# Patient Record
Sex: Male | Born: 1965
Health system: Southern US, Community
[De-identification: ages and names within clinical notes are randomized; demographics above are authoritative.]

## PROBLEM LIST (undated history)

## (undated) DIAGNOSIS — E119 Type 2 diabetes mellitus without complications: Secondary | ICD-10-CM

## (undated) DIAGNOSIS — I10 Essential (primary) hypertension: Secondary | ICD-10-CM

## (undated) DIAGNOSIS — E669 Obesity, unspecified: Secondary | ICD-10-CM

## (undated) DIAGNOSIS — E785 Hyperlipidemia, unspecified: Secondary | ICD-10-CM

## (undated) DIAGNOSIS — L309 Dermatitis, unspecified: Secondary | ICD-10-CM

## (undated) HISTORY — DX: Essential (primary) hypertension: I10

## (undated) HISTORY — PX: EYE SURGERY: SHX253

## (undated) HISTORY — PX: HERNIA REPAIR: SHX51

## (undated) HISTORY — DX: Hyperlipidemia, unspecified: E78.5

## (undated) HISTORY — DX: Obesity, unspecified: E66.9

## (undated) HISTORY — DX: Type 2 diabetes mellitus without complications: E11.9

## (undated) HISTORY — DX: Dermatitis, unspecified: L30.9

## (undated) HISTORY — PX: INGUINAL HERNIA REPAIR: SHX194

---

## 2014-03-24 ENCOUNTER — Emergency Department
Admission: EM | Admit: 2014-03-24 | Discharge: 2014-03-24 | Disposition: A | Discharge status: Home / Self Care | Payer: PRIVATE HEALTH INSURANCE | Source: Home / Self Care | Attending: Emergency Medicine | Admitting: Emergency Medicine

## 2014-03-24 ENCOUNTER — Encounter: Payer: Self-pay | Admitting: *Deleted

## 2014-03-24 DIAGNOSIS — L509 Urticaria, unspecified: Secondary | ICD-10-CM

## 2014-03-24 DIAGNOSIS — R21 Rash and other nonspecific skin eruption: Secondary | ICD-10-CM | POA: Diagnosis not present

## 2014-03-24 MED ORDER — PREDNISONE 10 MG PO TABS: ORAL_TABLET | ORAL | Status: DC

## 2014-03-24 NOTE — ED Provider Notes (Signed)
CSN: [id number]     Arrival date & time 03/24/14  0830 History   First MD Initiated Contact with Patient 03/24/14 [phone]     Chief Complaint  Patient presents with  . Rash   (Consider location/radiation/quality/duration/timing/severity/associated sxs/prior Treatment) Patient is a 49 y.o. male presenting with rash. The history is provided by the patient. No language interpreter was used.  Rash Location:  Shoulder/arm Shoulder/arm rash location:  L upper arm, R upper arm, L forearm and R forearm Quality: itchiness and redness   Severity:  Moderate Onset quality:  Gradual Duration:  2 days Timing:  Constant Progression:  Worsening Chronicity:  New Relieved by:  Nothing Worsened by:  Nothing tried Ineffective treatments:  None tried   History reviewed. No pertinent past medical history. Past Surgical History  Procedure Laterality Date  . Hernia repair    . Eye surgery     Family History  Problem Relation Age of Onset  . Thrombosis Mother   . Stroke Father   . Cancer Father    History  Substance Use Topics  . Smoking status: Never Smoker   . Smokeless tobacco: Never Used  . Alcohol Use: No    Review of Systems  Skin: Positive for rash.  All other systems reviewed and are negative.   Allergies  Review of patient's allergies indicates no known allergies.  Home Medications   Prior to Admission medications   Medication Sig Start Date End Date Taking? Authorizing Provider  predniSONE (DELTASONE) 10 MG tablet 6,5,4,3,2,1 taper 03/24/14   Lonia SkinnerLeslie K Sofia, PA-C   BP 150/102 mmHg  Pulse 93  Temp(Src) 97.9 F (36.6 C) (Oral)  Resp 14  Ht 5' 10.5" (1.791 m)  Wt 270 lb (122.471 kg)  BMI 38.18 kg/m2  SpO2 97% Physical Exam  Constitutional: He is oriented to person, place, and time. He appears well-developed and well-nourished.  HENT:  Head: Normocephalic and atraumatic.  Eyes: Conjunctivae and EOM are normal. Pupils are equal, round, and reactive to light.  Neck:  Normal range of motion.  Cardiovascular: Normal rate.   Pulmonary/Chest: Effort normal.  Abdominal: He exhibits no distension.  Musculoskeletal: Normal range of motion.  Neurological: He is alert and oriented to person, place, and time.  Skin:  Red raised whelps arms  Psychiatric: He has a normal mood and affect.  Nursing note and vitals reviewed.   ED Course  Procedures (including critical care time) Labs Review Labs Reviewed - No data to display  Imaging Review No results found.   MDM   1. Hives    Prednisone taper Benadryl AVS    Lonia SkinnerLeslie K EnetaiSofia, PA-C 03/24/14 [phone]

## 2014-03-24 NOTE — Discharge Instructions (Signed)
Hives Hives are itchy, red, swollen areas of the skin. They can vary in size and location on your body. Hives can come and go for hours or several days (acute hives) or for several weeks (chronic hives). Hives do not spread from person to person (noncontagious). They may get worse with scratching, exercise, and emotional stress. CAUSES   Allergic reaction to food, additives, or drugs.  Infections, including the common cold.  Illness, such as vasculitis, lupus, or thyroid disease.  Exposure to sunlight, heat, or cold.  Exercise.  Stress.  Contact with chemicals. SYMPTOMS   Red or white swollen patches on the skin. The patches may change size, shape, and location quickly and repeatedly.  Itching.  Swelling of the hands, feet, and face. This may occur if hives develop deeper in the skin. DIAGNOSIS  Your caregiver can usually tell what is wrong by performing a physical exam. Skin or blood tests may also be done to determine the cause of your hives. In some cases, the cause cannot be determined. TREATMENT  Mild cases usually get better with medicines such as antihistamines. Severe cases may require an emergency epinephrine injection. If the cause of your hives is known, treatment includes avoiding that trigger.  HOME CARE INSTRUCTIONS   Avoid causes that trigger your hives.  Take antihistamines as directed by your caregiver to reduce the severity of your hives. Non-sedating or low-sedating antihistamines are usually recommended. Do not drive while taking an antihistamine.  Take any other medicines prescribed for itching as directed by your caregiver.  Wear loose-fitting clothing.  Keep all follow-up appointments as directed by your caregiver. SEEK MEDICAL CARE IF:   You have persistent or severe itching that is not relieved with medicine.  You have painful or swollen joints. SEEK IMMEDIATE MEDICAL CARE IF:   You have a fever.  Your tongue or lips are swollen.  You have  trouble breathing or swallowing.  You feel tightness in the throat or chest.  You have abdominal pain. These problems may be the first sign of a life-threatening allergic reaction. Call your local emergency services (911 in [place] MAKE SURE YOU:   Understand these instructions.  Will watch your condition.  Will get help right away if you are not doing well or get worse. Document Released: 01/14/2005 Document Revised: 01/19/2013 Document Reviewed: 04/09/2011 [employer] Patient Information 2015 [employer]. This information is not intended to replace advice given to you by your health care provider. Make sure you discuss any questions you have with your health care provider.  

## 2014-03-24 NOTE — ED Notes (Signed)
Red, raised painful rash to both arms x yesterday AM. Pain with touch.

## 2014-05-16 ENCOUNTER — Telehealth: Payer: Self-pay | Admitting: *Deleted

## 2014-05-16 NOTE — Telephone Encounter (Signed)
Pre visit -unable to reach pt at this time , will call again.

## 2014-05-17 ENCOUNTER — Encounter: Payer: Self-pay | Admitting: Family Medicine

## 2014-05-17 ENCOUNTER — Ambulatory Visit (INDEPENDENT_AMBULATORY_CARE_PROVIDER_SITE_OTHER): Payer: Managed Care, Other (non HMO) | Admitting: Family Medicine

## 2014-05-17 VITALS — BP 158/99 | HR 82 | Temp 98.3°F | Ht 70.5 in | Wt 269.2 lb

## 2014-05-17 DIAGNOSIS — R21 Rash and other nonspecific skin eruption: Secondary | ICD-10-CM | POA: Diagnosis not present

## 2014-05-17 DIAGNOSIS — E669 Obesity, unspecified: Secondary | ICD-10-CM | POA: Diagnosis not present

## 2014-05-17 DIAGNOSIS — E785 Hyperlipidemia, unspecified: Secondary | ICD-10-CM | POA: Insufficient documentation

## 2014-05-17 DIAGNOSIS — I10 Essential (primary) hypertension: Secondary | ICD-10-CM | POA: Diagnosis not present

## 2014-05-17 DIAGNOSIS — E119 Type 2 diabetes mellitus without complications: Secondary | ICD-10-CM

## 2014-05-17 HISTORY — DX: Obesity, unspecified: E66.9

## 2014-05-17 LAB — TSH: TSH: 1.99 u[IU]/mL (ref 0.35–4.50)

## 2014-05-17 LAB — COMPREHENSIVE METABOLIC PANEL
ALT: 24 U/L (ref 0–53)
AST: 20 U/L (ref 0–37)
Albumin: 4.1 g/dL (ref 3.5–5.2)
Alkaline Phosphatase: 96 U/L (ref 39–117)
BILIRUBIN TOTAL: 0.7 mg/dL (ref 0.2–1.2)
BUN: 16 mg/dL (ref 6–23)
CO2: 26 meq/L (ref 19–32)
Calcium: 9.5 mg/dL (ref 8.4–10.5)
Chloride: 101 mEq/L (ref 96–112)
Creatinine, Ser: 0.75 mg/dL (ref 0.40–1.50)
GFR: 118.92 mL/min (ref >60.00)
GLUCOSE: 275 mg/dL — AB (ref 70–99)
Potassium: 4.1 mEq/L (ref 3.5–5.1)
SODIUM: 135 meq/L (ref 135–145)
TOTAL PROTEIN: 7.1 g/dL (ref 6.0–8.3)

## 2014-05-17 LAB — LIPID PANEL
CHOL/HDL RATIO: 5
Cholesterol: 193 mg/dL (ref 0–200)
HDL: 38.3 mg/dL — ABNORMAL LOW (ref >39.00)
LDL CALC: 118 mg/dL — AB (ref 0–99)
NONHDL: 154.7
Triglycerides: 185 mg/dL — ABNORMAL HIGH (ref 0.0–149.0)
VLDL: 37 mg/dL (ref 0.0–40.0)

## 2014-05-17 LAB — CBC
HCT: 44.4 % (ref 39.0–52.0)
HEMOGLOBIN: 15.3 g/dL (ref 13.0–17.0)
MCHC: 34.5 g/dL (ref 30.0–36.0)
MCV: 84.3 fl (ref 78.0–100.0)
Platelets: 271 10*3/uL (ref 150.0–400.0)
RBC: 5.27 Mil/uL (ref 4.22–5.81)
RDW: 13.2 % (ref 11.5–15.5)
WBC: 7.3 10*3/uL (ref 4.0–10.5)

## 2014-05-17 LAB — HEMOGLOBIN A1C: Hgb A1c MFr Bld: 10.9 % — ABNORMAL HIGH (ref 4.6–6.5)

## 2014-05-17 MED ORDER — LISINOPRIL 10 MG PO TABS: 10.0000 mg | ORAL_TABLET | Freq: Two times a day (BID) | ORAL | Status: DC

## 2014-05-17 MED ORDER — PERMETHRIN 5 % EX CREA: 1.0000 "application " | TOPICAL_CREAM | Freq: Once | CUTANEOUS | Status: DC

## 2014-05-17 MED ORDER — ATORVASTATIN CALCIUM 10 MG PO TABS
10.0000 mg | ORAL_TABLET | Freq: Every day | ORAL | Status: DC
Start: 2014-05-17 — End: 2014-07-01

## 2014-05-17 MED ORDER — METFORMIN HCL ER 750 MG PO TB24: 750.0000 mg | ORAL_TABLET | Freq: Three times a day (TID) | ORAL | Status: DC

## 2014-05-17 NOTE — Patient Instructions (Signed)
Dr Chrissie Noa zweig, in [place], [place] [facility] A healthy lifestyle and preventive care can promote health and wellness. Preventive health guidelines for men include the following key practices:  A routine yearly physical is a good way to check with your health care provider about your health and preventative screening. It is a chance to share any concerns and updates on your health and to receive a thorough exam.  Visit your dentist for a routine exam and preventative care every 6 months. Brush your teeth twice a day and floss once a day. Good oral hygiene prevents tooth decay and gum disease.  The frequency of eye exams is based on your age, health, family medical history, use of contact lenses, and other factors. Follow your health care provider's recommendations for frequency of eye exams.  Eat a healthy diet. Foods such as vegetables, fruits, whole grains, low-fat da    iry products, and lean protein foods contain the nutrients you need without too many calories. Decrease your intake of foods high in solid fats, added sugars, and salt. Eat the right amount of calories for you.Get information about a proper diet from your health care provider, if necessary.  Regular physical exercise is one of the most important things you can do for your health. Most adults should get at least 150 minutes of moderate-intensity exercise (any activity that increases your heart rate and causes you to sweat) each week. In addition, most adults need muscle-strengthening exercises on 2 or more days a week.  Maintain a healthy weight. The body mass index (BMI) is a screening tool to identify possible weight problems. It provides an estimate of body fat based on height and weight. Your health care provider can find your BMI and can help you achieve or maintain a healthy weight.For adults 20 years and older:  A BMI below 18.5 is considered underweight.  A BMI of 18.5 to 24.9 is normal.  A  BMI of 25 to 29.9 is considered overweight.  A BMI of 30 and above is considered obese.  Maintain normal blood lipids and cholesterol levels by exercising and minimizing your intake of saturated fat. Eat a balanced diet with plenty of fruit and vegetables. Blood tests for lipids and cholesterol should begin at age 75 and be repeated every 5 years. If your lipid or cholesterol levels are high, you are over 50, or you are at high risk for heart disease, you may need your cholesterol levels checked more frequently.Ongoing high lipid and cholesterol levels should be treated with medicines if diet and exercise are not working.  If you smoke, find out from your health care provider how to quit. If you do not use tobacco, do not start.  Lung cancer screening is recommended for adults aged 64-80 years who are at high risk for developing lung cancer because of a history of smoking. A yearly low-dose CT scan of the lungs is recommended for people who have at least a 30-pack-year history of smoking and are a current smoker or have quit within the past 15 years. A pack year of smoking is smoking an average of 1 pack of cigarettes a day for 1 year (for example: 1 pack a day for 30 years or 2 packs a day for 15 years). Yearly screening should continue until the smoker has stopped smoking for at least 15 years. Yearly screening should be stopped for people who develop a health problem that would prevent them from having lung cancer treatment.  If you choose to drink alcohol, do not have more than 2 drinks per day. One drink is considered to be 12 ounces (355 mL) of beer, 5 ounces (148 mL) of wine, or 1.5 ounces (44 mL) of liquor.  Avoid use of street drugs. Do not share needles with anyone. Ask for help if you need support or instructions about stopping the use of drugs.  High blood pressure causes heart disease and increases the risk of stroke. Your blood pressure should be checked at least every 1-2 years. Ongoing  high blood pressure should be treated with medicines, if weight loss and exercise are not effective.  If you are 60-40 years old, ask your health care provider if you should take aspirin to prevent heart disease.  Diabetes screening involves taking a blood sample to check your fasting blood sugar level. This should be done once every 3 years, after age 43, if you are within normal weight and without risk factors for diabetes. Testing should be considered at a younger age or be carried out more frequently if you are overweight and have at least 1 risk factor for diabetes.  Colorectal cancer can be detected and often prevented. Most routine colorectal cancer screening begins at the age of 58 and continues through age 45. However, your health care provider may recommend screening at an earlier age if you have risk factors for colon cancer. On a yearly basis, your health care provider may provide home test kits to check for hidden blood in the stool. Use of a small camera at the end of a tube to directly examine the colon (sigmoidoscopy or colonoscopy) can detect the earliest forms of colorectal cancer. Talk to your health care provider about this at age 57, when routine screening begins. Direct exam of the colon should be repeated every 5-10 years through age 49, unless early forms of precancerous polyps or small growths are found.  People who are at an increased risk for hepatitis B should be screened for this virus. You are considered at high risk for hepatitis B if:  You were born in a country where hepatitis B occurs often. Talk with your health care provider about which countries are considered high risk.  Your parents were born in a high-risk country and you have not received a shot to protect against hepatitis B (hepatitis B vaccine).  You have HIV or AIDS.  You use needles to inject street drugs.  You live with, or have sex with, someone who has hepatitis B.  You are a man who has sex with  other men (MSM).  You get hemodialysis treatment.  You take certain medicines for conditions such as cancer, organ transplantation, and autoimmune conditions.  Hepatitis C blood testing is recommended for all people born from [birth year] through 1965 and any individual with known risks for hepatitis C.  Practice safe sex. Use condoms and avoid high-risk sexual practices to reduce the spread of sexually transmitted infections (STIs). STIs include gonorrhea, chlamydia, syphilis, trichomonas, herpes, HPV, and human immunodeficiency virus (HIV). Herpes, HIV, and HPV are viral illnesses that have no cure. They can result in disability, cancer, and death.  If you are at risk of being infected with HIV, it is recommended that you take a prescription medicine daily to prevent HIV infection. This is called preexposure prophylaxis (PrEP). You are considered at risk if:  You are a man who has sex with other men (MSM) and have other risk factors.  You are a heterosexual man,  are sexually active, and are at increased risk for HIV infection.  You take drugs by injection.  You are sexually active with a partner who has HIV.  Talk with your health care provider about whether you are at high risk of being infected with HIV. If you choose to begin PrEP, you should first be tested for HIV. You should then be tested every 3 months for as long as you are taking PrEP.  A one-time screening for abdominal aortic aneurysm (AAA) and surgical repair of large AAAs by ultrasound are recommended for men ages 26 to 69 years who are current or former smokers.  Healthy men should no longer receive prostate-specific antigen (PSA) blood tests as part of routine cancer screening. Talk with your health care provider about prostate cancer screening.  Testicular cancer screening is not recommended for adult males who have no symptoms. Screening includes self-exam, a health care provider exam, and other screening tests. Consult with  your health care provider about any symptoms you have or any concerns you have about testicular cancer.  Use sunscreen. Apply sunscreen liberally and repeatedly throughout the day. You should seek shade when your shadow is shorter than you. Protect yourself by wearing long sleeves, pants, a wide-brimmed hat, and sunglasses year round, whenever you are outdoors.  Once a month, do a whole-body skin exam, using a mirror to look at the skin on your back. Tell your health care provider about new moles, moles that have irregular borders, moles that are larger than a pencil eraser, or moles that have changed in shape or color.  Stay current with required vaccines (immunizations).  Influenza vaccine. All adults should be immunized every year.  Tetanus, diphtheria, and acellular pertussis (Td, Tdap) vaccine. An adult who has not previously received Tdap or who does not know his vaccine status should receive 1 dose of Tdap. This initial dose should be followed by tetanus and diphtheria toxoids (Td) booster doses every 10 years. Adults with an unknown or incomplete history of completing a 3-dose immunization series with Td-containing vaccines should begin or complete a primary immunization series including a Tdap dose. Adults should receive a Td booster every 10 years.  Varicella vaccine. An adult without evidence of immunity to varicella should receive 2 doses or a second dose if he has previously received 1 dose.  Human papillomavirus (HPV) vaccine. Males aged 67-21 years who have not received the vaccine previously should receive the 3-dose series. Males aged 22-26 years may be immunized. Immunization is recommended through the age of 35 years for any male who has sex with males and did not get any or all doses earlier. Immunization is recommended for any person with an immunocompromised condition through the age of 65 years if he did not get any or all doses earlier. During the 3-dose series, the second dose  should be obtained 4-8 weeks after the first dose. The third dose should be obtained 24 weeks after the first dose and 16 weeks after the second dose.  Zoster vaccine. One dose is recommended for adults aged 38 years or older unless certain conditions are present.  Measles, mumps, and rubella (MMR) vaccine. Adults born before [birth year] generally are considered immune to measles and mumps. Adults born in [birth year] or later should have 1 or more doses of MMR vaccine unless there is a contraindication to the vaccine or there is laboratory evidence of immunity to each of the three diseases. A routine second dose of MMR vaccine should be obtained  at least 28 days after the first dose for students attending postsecondary schools, health care workers, or international travelers. People who received inactivated measles vaccine or an unknown type of measles vaccine during 1963-1967 should receive 2 doses of MMR vaccine. People who received inactivated mumps vaccine or an unknown type of mumps vaccine before 1979 and are at high risk for mumps infection should consider immunization with 2 doses of MMR vaccine. Unvaccinated health care workers born before [birth year] who lack laboratory evidence of measles, mumps, or rubella immunity or laboratory confirmation of disease should consider measles and mumps immunization with 2 doses of MMR vaccine or rubella immunization with 1 dose of MMR vaccine.  Pneumococcal 13-valent conjugate (PCV13) vaccine. When indicated, a person who is uncertain of his immunization history and has no record of immunization should receive the PCV13 vaccine. An adult aged 59 years or older who has certain medical conditions and has not been previously immunized should receive 1 dose of PCV13 vaccine. This PCV13 should be followed with a dose of pneumococcal polysaccharide (PPSV23) vaccine. The PPSV23 vaccine dose should be obtained at least 8 weeks after the dose of PCV13 vaccine. An adult aged 3 years or older  who has certain medical conditions and previously received 1 or more doses of PPSV23 vaccine should receive 1 dose of PCV13. The PCV13 vaccine dose should be obtained 1 or more years after the last PPSV23 vaccine dose.  Pneumococcal polysaccharide (PPSV23) vaccine. When PCV13 is also indicated, PCV13 should be obtained first. All adults aged 54 years and older should be immunized. An adult younger than age 28 years who has certain medical conditions should be immunized. Any person who resides in a nursing home or long-term care facility should be immunized. An adult smoker should be immunized. People with an immunocompromised condition and certain other conditions should receive both PCV13 and PPSV23 vaccines. People with human immunodeficiency virus (HIV) infection should be immunized as soon as possible after diagnosis. Immunization during chemotherapy or radiation therapy should be avoided. Routine use of PPSV23 vaccine is not recommended for American Indians, [place] Natives, or people younger than 65 years unless there are medical conditions that require PPSV23 vaccine. When indicated, people who have unknown immunization and have no record of immunization should receive PPSV23 vaccine. One-time revaccination 5 years after the first dose of PPSV23 is recommended for people aged 19-64 years who have chronic kidney failure, nephrotic syndrome, asplenia, or immunocompromised conditions. People who received 1-2 doses of PPSV23 before age 16 years should receive another dose of PPSV23 vaccine at age 2 years or later if at least 5 years have passed since the previous dose. Doses of PPSV23 are not needed for people immunized with PPSV23 at or after age 55 years.  Meningococcal vaccine. Adults with asplenia or persistent complement component deficiencies should receive 2 doses of quadrivalent meningococcal conjugate (MenACWY-D) vaccine. The doses should be obtained at least 2 months apart. Microbiologists working  with certain meningococcal bacteria, [place] recruits, people at risk during an outbreak, and people who travel to or live in countries with a high rate of meningitis should be immunized. A first-year college student up through age 106 years who is living in a residence hall should receive a dose if he did not receive a dose on or after his 16th birthday. Adults who have certain high-risk conditions should receive one or more doses of vaccine.  Hepatitis A vaccine. Adults who wish to be protected from this disease, have certain high-risk conditions,  work with hepatitis A-infected animals, work in hepatitis A research labs, or travel to or work in countries with a high rate of hepatitis A should be immunized. Adults who were previously unvaccinated and who anticipate close contact with an international adoptee during the first 60 days after arrival in the [place] from a country with a high rate of hepatitis A should be immunized.  Hepatitis B vaccine. Adults should be immunized if they wish to be protected from this disease, have certain high-risk conditions, may be exposed to blood or other infectious body fluids, are household contacts or sex partners of hepatitis B positive people, are clients or workers in certain care facilities, or travel to or work in countries with a high rate of hepatitis B.  Haemophilus influenzae type b (Hib) vaccine. A previously unvaccinated person with asplenia or sickle cell disease or having a scheduled splenectomy should receive 1 dose of Hib vaccine. Regardless of previous immunization, a recipient of a hematopoietic stem cell transplant should receive a 3-dose series 6-12 months after his successful transplant. Hib vaccine is not recommended for adults with HIV infection. Preventive Service / Frequency Ages 22 to 34  Blood pressure check.** / Every 1 to 2 years.  Lipid and cholesterol check.** / Every 5 years beginning at age 55.  Hepatitis C blood test.** / For  any individual with known risks for hepatitis C.  Skin self-exam. / Monthly.  Influenza vaccine. / Every year.  Tetanus, diphtheria, and acellular pertussis (Tdap, Td) vaccine.** / Consult your health care provider. 1 dose of Td every 10 years.  Varicella vaccine.** / Consult your health care provider.  HPV vaccine. / 3 doses over 6 months, if 1 or younger.  Measles, mumps, rubella (MMR) vaccine.** / You need at least 1 dose of MMR if you were born in 1957 or later. You may also need a second dose.  Pneumococcal 13-valent conjugate (PCV13) vaccine.** / Consult your health care provider.  Pneumococcal polysaccharide (PPSV23) vaccine.** / 1 to 2 doses if you smoke cigarettes or if you have certain conditions.  Meningococcal vaccine.** / 1 dose if you are age 87 to 37 years and a Market researcher living in a residence hall, or have one of several medical conditions. You may also need additional booster doses.  Hepatitis A vaccine.** / Consult your health care provider.  Hepatitis B vaccine.** / Consult your health care provider.  Haemophilus influenzae type b (Hib) vaccine.** / Consult your health care provider. Ages 32 to 52  Blood pressure check.** / Every 1 to 2 years.  Lipid and cholesterol check.** / Every 5 years beginning at age 50.  Lung cancer screening. / Every year if you are aged 56-80 years and have a 30-pack-year history of smoking and currently smoke or have quit within the past 15 years. Yearly screening is stopped once you have quit smoking for at least 15 years or develop a health problem that would prevent you from having lung cancer treatment.  Fecal occult blood test (FOBT) of stool. / Every year beginning at age 22 and continuing until age 82. You may not have to do this test if you get a colonoscopy every 10 years.  Flexible sigmoidoscopy** or colonoscopy.** / Every 5 years for a flexible sigmoidoscopy or every 10 years for a colonoscopy beginning at  age 9 and continuing until age 73.  Hepatitis C blood test.** / For all people born from [birth year] through 1963 and any individual with known risks for  hepatitis C.  Skin self-exam. / Monthly.  Influenza vaccine. / Every year.  Tetanus, diphtheria, and acellular pertussis (Tdap/Td) vaccine.** / Consult your health care provider. 1 dose of Td every 10 years.  Varicella vaccine.** / Consult your health care provider.  Zoster vaccine.** / 1 dose for adults aged 4 years or older.  Measles, mumps, rubella (MMR) vaccine.** / You need at least 1 dose of MMR if you were born in 1955 or later. You may also need a second dose.  Pneumococcal 13-valent conjugate (PCV13) vaccine.** / Consult your health care provider.  Pneumococcal polysaccharide (PPSV23) vaccine.** / 1 to 2 doses if you smoke cigarettes or if you have certain conditions.  Meningococcal vaccine.** / Consult your health care provider.  Hepatitis A vaccine.** / Consult your health care provider.  Hepatitis B vaccine.** / Consult your health care provider.  Haemophilus influenzae type b (Hib) vaccine.** / Consult your health care provider. Ages 57 and over  Blood pressure check.** / Every 1 to 2 years.  Lipid and cholesterol check.**/ Every 5 years beginning at age 66.  Lung cancer screening. / Every year if you are aged 41-80 years and have a 30-pack-year history of smoking and currently smoke or have quit within the past 15 years. Yearly screening is stopped once you have quit smoking for at least 15 years or develop a health problem that would prevent you from having lung cancer treatment.  Fecal occult blood test (FOBT) of stool. / Every year beginning at age 26 and continuing until age 27. You may not have to do this test if you get a colonoscopy every 10 years.  Flexible sigmoidoscopy** or colonoscopy.** / Every 5 years for a flexible sigmoidoscopy or every 10 years for a colonoscopy beginning at age 90 and continuing until  age 71.  Hepatitis C blood test.** / For all people born from [birth year] through 1963 and any individual with known risks for hepatitis C.  Abdominal aortic aneurysm (AAA) screening.** / A one-time screening for ages 23 to 21 years who are current or former smokers.  Skin self-exam. / Monthly.  Influenza vaccine. / Every year.  Tetanus, diphtheria, and acellular pertussis (Tdap/Td) vaccine.** / 1 dose of Td every 10 years.  Varicella vaccine.** / Consult your health care provider.  Zoster vaccine.** / 1 dose for adults aged 42 years or older.  Pneumococcal 13-valent conjugate (PCV13) vaccine.** / Consult your health care provider.  Pneumococcal polysaccharide (PPSV23) vaccine.** / 1 dose for all adults aged 85 years and older.  Meningococcal vaccine.** / Consult your health care provider.  Hepatitis A vaccine.** / Consult your health care provider.  Hepatitis B vaccine.** / Consult your health care provider.  Haemophilus influenzae type b (Hib) vaccine.** / Consult your health care provider. **Family history and personal history of risk and conditions may change your health care provider's recommendations. Document Released: 03/12/2001 Document Revised: 01/19/2013 Document Reviewed: 06/11/2010 [facility] Patient Information 2015 [place], [place]. This information is not intended to replace advice given to you by your health care provider. Make sure you discuss any questions you have with your health care provider.

## 2014-05-17 NOTE — Progress Notes (Signed)
Pre visit review using our clinic review tool, if applicable. No additional management support is needed unless otherwise documented below in the visit note. 

## 2014-05-22 DIAGNOSIS — R21 Rash and other nonspecific skin eruption: Secondary | ICD-10-CM | POA: Insufficient documentation

## 2014-05-22 NOTE — Assessment & Plan Note (Signed)
Consider bed bugs. Started on Permethrin cream as directed and perform a deep cleanse

## 2014-05-22 NOTE — Progress Notes (Signed)
Craig RankinsKenneth Waldman  [id number] July 10, 1965 05/22/2014      Progress Note-Follow Up  Subjective  Chief Complaint  Chief Complaint  Patient presents with  . Establish Care    HPI  Patient is a 49 y.o. male in today for routine medical care. Patient is in today for new patient appointment. He is due to the area beneath the primary care doctor. Will request old records. Has a pruritic maculopapular rash noted diffusely but most notably at the maximal his knees and in the flexor surfaces of his elbows. He has been staying in a motel. Recent illness although he does note significant fatigue. He is out of all his medications at present time. Denies CP/palp/SOB/HA/congestion/fevers/GI or GU c/o. Taking meds as prescribed  Past Medical History  Diagnosis Date  . Diabetes mellitus without complication   . Hyperlipidemia   . Hypertension   . Obesity 05/17/2014    Past Surgical History  Procedure Laterality Date  . Eye surgery    . Inguinal hernia repair    . Hernia repair      Family History  Problem Relation Age of Onset  . Stroke Mother     ?  Marland Kitchen. Thrombosis Mother     brain aneurysm  . Clotting disorder Mother     DVTs  . Cancer Father   . Stroke Father     3 strokes  . Cancer Sister     breast  . Cancer Paternal Grandfather     leukemia  . Clotting disorder Sister     lupus anticoagulant positive  . Hyperlipidemia Brother   . Hypertension Brother   . Heart disease Brother     MI    History   Social History  . Marital Status: Married    Spouse Name: N/A  . Number of Children: N/A  . Years of Education: 12   Occupational History  . 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 Smoking status: Never Smoker   . Smokeless tobacco: Never Used  . Alcohol Use: No  . Drug Use: No  . 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 Not on file   Social History Narrative    No current outpatient prescriptions on file prior to visit.   No current facility-administered medications on file prior to visit.    No Known Allergies  Review of Systems  Review of Systems  Constitutional: Positive for malaise/fatigue. Negative for fever and chills.  HENT: Negative for congestion, hearing loss and nosebleeds.   Eyes: Negative for discharge.  Respiratory: Negative for cough, sputum production, shortness of breath and wheezing.   Cardiovascular: Negative for chest pain, palpitations and leg swelling.  Gastrointestinal: Negative for heartburn, nausea, vomiting, abdominal pain, diarrhea, constipation and blood in stool.  Genitourinary: Negative for dysuria, urgency, frequency and hematuria.  Musculoskeletal: Negative for myalgias, back pain and falls.  Skin: Negative for rash.  Neurological: Negative for dizziness, tremors, sensory change, focal weakness, loss of consciousness, weakness and headaches.  Endo/Heme/Allergies: Negative for polydipsia. Does not bruise/bleed easily.  Psychiatric/Behavioral: Positive for depression. Negative for suicidal ideas. The patient is nervous/anxious. The patient does not have insomnia.     Objective  BP 158/99 mmHg  Pulse 82  Temp(Src) 98.3 F (36.8 C) (Oral)  Ht 5' 10.5" (1.791 m)  Wt 269 lb 4  oz (122.131 kg)  BMI 38.07 kg/m2  SpO2 99%  Physical Exam  Physical Exam  Constitutional: He is oriented to person, place, and time and well-developed, well-nourished, and in no distress. No distress.  HENT:  Head: Normocephalic and atraumatic.  Eyes: Conjunctivae are normal.  Neck: Neck supple. No thyromegaly present.  Cardiovascular: Normal rate, regular rhythm and normal heart sounds.   No murmur heard. Pulmonary/Chest: Effort normal and breath sounds normal. No respiratory distress.  Abdominal: He exhibits no distension and no mass. There is no tenderness.    Musculoskeletal: He exhibits no edema.  Neurological: He is alert and oriented to person, place, and time.  Skin: Skin is warm.  Psychiatric: Memory, affect and judgment normal.    Lab Results  Component Value Date   TSH 1.99 05/17/2014   Lab Results  Component Value Date   WBC 7.3 05/17/2014   HGB 15.3 05/17/2014   HCT 44.4 05/17/2014   MCV 84.3 05/17/2014   PLT 271.0 05/17/2014   Lab Results  Component Value Date   CREATININE 0.75 05/17/2014   BUN 16 05/17/2014   NA 135 05/17/2014   K 4.1 05/17/2014   CL 101 05/17/2014   CO2 26 05/17/2014   Lab Results  Component Value Date   ALT 24 05/17/2014   AST 20 05/17/2014   ALKPHOS 96 05/17/2014   BILITOT 0.7 05/17/2014   Lab Results  Component Value Date   CHOL 193 05/17/2014   Lab Results  Component Value Date   HDL 38.30* 05/17/2014   Lab Results  Component Value Date   LDLCALC 118* 05/17/2014   Lab Results  Component Value Date   TRIG 185.0* 05/17/2014   Lab Results  Component Value Date   CHOLHDL 5 05/17/2014     Assessment & Plan  Hypertension Poorly controlled will alter medications, encouraged DASH diet, minimize caffeine and obtain adequate sleep. Report concerning symptoms and follow up as directed and as needed. Increase Lisinopril to bid   Diabetes mellitus without complication Has been out of meds. hgba1c unacceptable, minimize simple carbs. Increase exercise as tolerated. Restart Metformin tid   Rash and nonspecific skin eruption Consider bed bugs. Started on Permethrin cream as directed and perform a deep cleanse   Hyperlipidemia Tolerating statin, encouraged heart healthy diet, avoid trans fats, minimize simple carbs and saturated fats. Increase exercise as tolerated. Restart statin   Obesity Encouraged DASH diet, decrease po intake and increase exercise as tolerated. Needs 7-8 hours of sleep nightly. Avoid trans fats, eat small, frequent meals every 4-5 hours with lean proteins,  complex carbs and healthy fats. Minimize simple carbs

## 2014-05-22 NOTE — Assessment & Plan Note (Signed)
Poorly controlled will alter medications, encouraged DASH diet, minimize caffeine and obtain adequate sleep. Report concerning symptoms and follow up as directed and as needed. Increase Lisinopril to bid 

## 2014-05-22 NOTE — Assessment & Plan Note (Signed)
Encouraged DASH diet, decrease po intake and increase exercise as tolerated. Needs 7-8 hours of sleep nightly. Avoid trans fats, eat small, frequent meals every 4-5 hours with lean proteins, complex carbs and healthy fats. Minimize simple carbs 

## 2014-05-22 NOTE — Assessment & Plan Note (Signed)
Tolerating statin, encouraged heart healthy diet, avoid trans fats, minimize simple carbs and saturated fats. Increase exercise as tolerated. Restart statin

## 2014-05-22 NOTE — Assessment & Plan Note (Signed)
Has been out of meds. hgba1c unacceptable, minimize simple carbs. Increase exercise as tolerated. Restart Metformin tid

## 2014-05-23 ENCOUNTER — Telehealth: Payer: Self-pay | Admitting: Family Medicine

## 2014-05-23 NOTE — Telephone Encounter (Signed)
Patient returned phone call. Best # [phone]

## 2014-05-23 NOTE — Telephone Encounter (Signed)
Called the patient left a detailed message to call back with info. Regarding specific glucometer/strips/lancets to send in to his local pharmacy per PCP.

## 2014-05-24 NOTE — Telephone Encounter (Signed)
Called left a detailed message of request. 

## 2014-05-24 NOTE — Telephone Encounter (Signed)
Spoke to the patient and he is moving at this time.  He would like to wait for a couple weeks before sending in a prescription due to money.  States will contact his insurance to find out their preference and either let us or pharmacy know what to order.

## 2014-07-01 ENCOUNTER — Encounter: Payer: Self-pay | Admitting: Family Medicine

## 2014-07-01 ENCOUNTER — Ambulatory Visit (HOSPITAL_BASED_OUTPATIENT_CLINIC_OR_DEPARTMENT_OTHER)
Admission: RE | Admit: 2014-07-01 | Discharge: 2014-07-01 | Disposition: A | Discharge status: Home / Self Care | Payer: Managed Care, Other (non HMO) | Source: Ambulatory Visit | Attending: Family Medicine | Admitting: Family Medicine

## 2014-07-01 ENCOUNTER — Ambulatory Visit (INDEPENDENT_AMBULATORY_CARE_PROVIDER_SITE_OTHER): Payer: Managed Care, Other (non HMO) | Admitting: Family Medicine

## 2014-07-01 VITALS — BP 143/94 | HR 78 | Temp 98.5°F | Ht 70.0 in | Wt 267.4 lb

## 2014-07-01 DIAGNOSIS — M25511 Pain in right shoulder: Secondary | ICD-10-CM

## 2014-07-01 DIAGNOSIS — I10 Essential (primary) hypertension: Secondary | ICD-10-CM

## 2014-07-01 DIAGNOSIS — E119 Type 2 diabetes mellitus without complications: Secondary | ICD-10-CM | POA: Diagnosis not present

## 2014-07-01 DIAGNOSIS — R21 Rash and other nonspecific skin eruption: Secondary | ICD-10-CM

## 2014-07-01 DIAGNOSIS — E669 Obesity, unspecified: Secondary | ICD-10-CM | POA: Diagnosis not present

## 2014-07-01 DIAGNOSIS — L309 Dermatitis, unspecified: Secondary | ICD-10-CM

## 2014-07-01 DIAGNOSIS — Z832 Family history of diseases of the blood and blood-forming organs and certain disorders involving the immune mechanism: Secondary | ICD-10-CM

## 2014-07-01 DIAGNOSIS — E785 Hyperlipidemia, unspecified: Secondary | ICD-10-CM

## 2014-07-01 DIAGNOSIS — Z8249 Family history of ischemic heart disease and other diseases of the circulatory system: Secondary | ICD-10-CM

## 2014-07-01 HISTORY — DX: Dermatitis, unspecified: L30.9

## 2014-07-01 MED ORDER — METFORMIN HCL ER 750 MG PO TB24: 750.0000 mg | ORAL_TABLET | Freq: Three times a day (TID) | ORAL | Status: DC

## 2014-07-01 MED ORDER — ATORVASTATIN CALCIUM 10 MG PO TABS: 10.0000 mg | ORAL_TABLET | Freq: Every day | ORAL | Status: DC

## 2014-07-01 MED ORDER — LISINOPRIL 10 MG PO TABS: 10.0000 mg | ORAL_TABLET | Freq: Two times a day (BID) | ORAL | Status: DC

## 2014-07-01 NOTE — Assessment & Plan Note (Signed)
Consider bed bugs but patient has not tried the Permethrin cream yet encouraged to try

## 2014-07-01 NOTE — Patient Instructions (Addendum)
Basic Carbohydrate Counting for Diabetes Mellitus Carbohydrate counting is a method for keeping track of the amount of carbohydrates you eat. Eating carbohydrates naturally increases the level of sugar (glucose) in your blood, so it is important for you to know the amount that is okay for you to have in every meal. Carbohydrate counting helps keep the level of glucose in your blood within normal limits. The amount of carbohydrates allowed is different for every person. A dietitian can help you calculate the amount that is right for you. Once you know the amount of carbohydrates you can have, you can count the carbohydrates in the foods you want to eat. Carbohydrates are found in the following foods:  Grains, such as breads and cereals.  Dried beans and soy products.  Starchy vegetables, such as potatoes, peas, and corn.  Fruit and fruit juices.  Milk and yogurt.  Sweets and snack foods, such as cake, cookies, candy, chips, soft drinks, and fruit drinks. CARBOHYDRATE COUNTING There are two ways to count the carbohydrates in your food. You can use either of the methods or a combination of both. Reading the "Nutrition Facts" on Packaged Food The "Nutrition Facts" is an area that is included on the labels of almost all packaged food and beverages in the [place]. It includes the serving size of that food or beverage and information about the nutrients in each serving of the food, including the grams (g) of carbohydrate per serving.  Decide the number of servings of this food or beverage that you will be able to eat or drink. Multiply that number of servings by the number of grams of carbohydrate that is listed on the label for that serving. The total will be the amount of carbohydrates you will be having when you eat or drink this food or beverage. Learning Standard Serving Sizes of Food When you eat food that is not packaged or does not include "Nutrition Facts" on the label, you need to  measure the servings in order to count the amount of carbohydrates.A serving of most carbohydrate-rich foods contains about 15 g of carbohydrates. The following list includes serving sizes of carbohydrate-rich foods that provide 15 g ofcarbohydrate per serving:   1 slice of bread (1 oz) or 1 six-inch tortilla.    of a hamburger bun or English muffin.  4-6 crackers.   cup unsweetened dry cereal.    cup hot cereal.   cup rice or pasta.    cup mashed potatoes or  of a large baked potato.  1 cup fresh fruit or one small piece of fruit.    cup canned or frozen fruit or fruit juice.  1 cup milk.   cup plain fat-free yogurt or yogurt sweetened with artificial sweeteners.   cup cooked dried beans or starchy vegetable, such as peas, corn, or potatoes.  Decide the number of standard-size servings that you will eat. Multiply that number of servings by 15 (the grams of carbohydrates in that serving). For example, if you eat 2 cups of strawberries, you will have eaten 2 servings and 30 g of carbohydrates (2 servings x 15 g = 30 g). For foods such as soups and casseroles, in which more than one food is mixed in, you will need to count the carbohydrates in each food that is included. EXAMPLE OF CARBOHYDRATE COUNTING Sample Dinner  3 oz chicken breast.   cup of brown rice.   cup of corn.  1 cup milk.   1 cup strawberries with   sugar-free whipped topping.  Carbohydrate Calculation Step 1: Identify the foods that contain carbohydrates:   Rice.   Corn.   Milk.   Strawberries. Step 2:Calculate the number of servings eaten of each:   2 servings of rice.   1 serving of corn.   1 serving of milk.   1 serving of strawberries. Step 3: Multiply each of those number of servings by 15 g:   2 servings of rice x 15 g = 30 g.   1 serving of corn x 15 g = 15 g.   1 serving of milk x 15 g = 15 g.   1 serving of strawberries x 15 g = 15 g. Step 4: Add  together all of the amounts to find the total grams of carbohydrates eaten: 30 g + 15 g + 15 g + 15 g = 75 g. Document Released: 01/14/2005 Document Revised: 05/31/2013 Document Reviewed: 12/11/2012 [facility] Patient Information 2015 [place], [place]. This information is not intended to replace advice given to you by your health care provider. Make sure you discuss any questions you have with your health care provider. Shoulder Pain The shoulder is the joint that connects your arms to your body. The bones that form the shoulder joint include the upper arm bone (humerus), the shoulder blade (scapula), and the collarbone (clavicle). The top of the humerus is shaped like a ball and fits into a rather flat socket on the scapula (glenoid cavity). A combination of muscles and strong, fibrous tissues that connect muscles to bones (tendons) support your shoulder joint and hold the ball in the socket. Small, fluid-filled sacs (bursae) are located in different areas of the joint. They act as cushions between the bones and the overlying soft tissues and help reduce friction between the gliding tendons and the bone as you move your arm. Your shoulder joint allows a wide range of motion in your arm. This range of motion allows you to do things like scratch your back or throw a ball. However, this range of motion also makes your shoulder more prone to pain from overuse and injury. Causes of shoulder pain can originate from both injury and overuse and usually can be grouped in the following four categories:  Redness, swelling, and pain (inflammation) of the tendon (tendinitis) or the bursae (bursitis).  Instability, such as a dislocation of the joint.  Inflammation of the joint (arthritis).  Broken bone (fracture). HOME CARE INSTRUCTIONS   Apply ice to the sore area.  Put ice in a plastic bag.  Place a towel between your skin and the bag.  Leave the ice on for 15-20 minutes, 3-4 times per day for the first 2  days, or as directed by your health care provider.  Stop using cold packs if they do not help with the pain.  If you have a shoulder sling or immobilizer, wear it as long as your caregiver instructs. Only remove it to shower or bathe. Move your arm as little as possible, but keep your hand moving to prevent swelling.  Squeeze a soft ball or foam pad as much as possible to help prevent swelling.  Only take over-the-counter or prescription medicines for pain, discomfort, or fever as directed by your caregiver. SEEK MEDICAL CARE IF:   Your shoulder pain increases, or new pain develops in your arm, hand, or fingers.  Your hand or fingers become cold and numb.  Your pain is not relieved with medicines. SEEK IMMEDIATE MEDICAL CARE IF:   Your  arm, hand, or fingers are numb or tingling.  Your arm, hand, or fingers are significantly swollen or turn white or blue. MAKE SURE YOU:   Understand these instructions.  Will watch your condition.  Will get help right away if you are not doing well or get worse. Document Released: 10/24/2004 Document Revised: 05/31/2013 Document Reviewed: 12/29/2010 [facility] Patient Information 2015 [place], [place]. This information is not intended to replace advice given to you by your health care provider. Make sure you discuss any questions you have with your health care provider.

## 2014-07-01 NOTE — Assessment & Plan Note (Signed)
Well controlled, no changes to meds. Encouraged heart healthy diet such as the DASH diet and exercise as tolerated.  °

## 2014-07-01 NOTE — Assessment & Plan Note (Signed)
Fell on right side last night with pain and decreased ROM, check Xray, ice and Salon pas patches bid to tid and report if persists for referal to sports med

## 2014-07-01 NOTE — Progress Notes (Signed)
Craig Vincent  [id number] Jun 15, 1965 07/01/2014      Progress Note-Follow Up  Subjective  Chief Complaint  Chief Complaint  Patient presents with  . Follow-up    HPI  Patient is a 49 y.o. male in today for routine medical care. Patient is in today for follow-up and generally is doing well but does offer some complaints. Did slip and fall on some wet grass last night and is having some right arm pain. Had immediate pain but it is not worsening. No significant swelling or neuropathic symptoms are noted. Does note some mild polyuria but denies polydipsia. Also notes some mild fatigue. Reports a family history of blood clots most notably in his mother and sister. His sister has been diagnosed with lupus anticoagulated. Patient himself has not had any blood clots. Denies CP/palp/SOB/HA/congestion/fevers/GI or GU c/o. Taking meds as prescribed  Past Medical History  Diagnosis Date  . Diabetes mellitus without complication   . Hyperlipidemia   . Hypertension   . Obesity 05/17/2014    Past Surgical History  Procedure Laterality Date  . Eye surgery    . Inguinal hernia repair    . Hernia repair      Family History  Problem Relation Age of Onset  . Stroke Mother     ?  Marland Kitchen Thrombosis Mother     brain aneurysm  . Clotting disorder Mother     DVTs  . Cancer Father   . Stroke Father     3 strokes  . Cancer Sister     breast  . Cancer Paternal Grandfather     leukemia  . Clotting disorder Sister     lupus anticoagulant positive  . Hyperlipidemia Brother   . Hypertension Brother   . Heart disease Brother     MI    History   Social History  . Marital Status: Married    Spouse Name: N/A  . Number of Children: N/A  . Years of Education: 12   Occupational History  . 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 Smoking status: Never Smoker   . Smokeless tobacco: Never Used  . Alcohol Use: No  . Drug Use: No  . Sexual Activity: Yes   Comment: works for a [employer] engineer, recently transferred to [employer], no dietary restrictions, wife lives in [place]   Other Topics Concern  . Not on file   Social History Narrative    Current Outpatient Prescriptions on File Prior to Visit  Medication Sig Dispense Refill  . permethrin (ACTICIN) 5 % cream Apply 1 application topically once. Wash off after 8 hours Repeat treatment in 3-4 weeks if persists 60 g 1   No current facility-administered medications on file prior to visit.    No Known Allergies  Review of Systems  Review of Systems  Constitutional: Positive for malaise/fatigue. Negative for fever.  HENT: Negative for congestion.   Eyes: Negative for discharge.  Respiratory: Negative for shortness of breath.   Cardiovascular: Negative for chest pain, palpitations and leg swelling.  Gastrointestinal: Negative for nausea, abdominal pain, diarrhea and constipation.  Genitourinary: Positive for frequency. Negative for dysuria and urgency.  Musculoskeletal: Positive for joint pain. Negative for falls.  Skin: Negative for rash.  Neurological: Negative for loss of consciousness and headaches.  Endo/Heme/Allergies: Negative for polydipsia.  Psychiatric/Behavioral: Negative for depression and suicidal ideas. The patient is not nervous/anxious and does not have insomnia.     Objective  BP 143/94 mmHg  Pulse 78  Temp(Src) 98.5 F (36.9 C) (Oral)  Ht 5\' 10"  (1.778 m)  Wt 267 lb 6 oz (121.281 kg)  BMI 38.36 kg/m2  SpO2 98%  Physical Exam  Physical Exam  Constitutional: He is oriented to person, place, and time and well-developed, well-nourished, and in no distress. No distress.  HENT:  Head: Normocephalic and atraumatic.  Eyes: Conjunctivae are normal.  Neck: Neck supple. No thyromegaly present.  Cardiovascular: Normal rate, regular rhythm and normal heart sounds.   No murmur heard. Pulmonary/Chest: Effort normal and breath sounds normal. No respiratory distress.    Abdominal: He exhibits no distension and no mass. There is no tenderness.  Musculoskeletal: He exhibits no edema.  Neurological: He is alert and oriented to person, place, and time.  Skin: Skin is warm.  Psychiatric: Memory, affect and judgment normal.    Lab Results  Component Value Date   TSH 1.99 05/17/2014   Lab Results  Component Value Date   WBC 7.3 05/17/2014   HGB 15.3 05/17/2014   HCT 44.4 05/17/2014   MCV 84.3 05/17/2014   PLT 271.0 05/17/2014   Lab Results  Component Value Date   CREATININE 0.75 05/17/2014   BUN 16 05/17/2014   NA 135 05/17/2014   K 4.1 05/17/2014   CL 101 05/17/2014   CO2 26 05/17/2014   Lab Results  Component Value Date   ALT 24 05/17/2014   AST 20 05/17/2014   ALKPHOS 96 05/17/2014   BILITOT 0.7 05/17/2014   Lab Results  Component Value Date   CHOL 193 05/17/2014   Lab Results  Component Value Date   HDL 38.30* 05/17/2014   Lab Results  Component Value Date   LDLCALC 118* 05/17/2014   Lab Results  Component Value Date   TRIG 185.0* 05/17/2014   Lab Results  Component Value Date   CHOLHDL 5 05/17/2014     Assessment & Plan  Diabetes mellitus without complication Patient has not picked up his glucometer yet, is given new prescription check daily and prn. hgba1c is unacceptable, minimize simple carbs. Increase exercise as tolerated. Continue current meds   Hypertension Well controlled, no changes to meds. Encouraged heart healthy diet such as the DASH diet and exercise as tolerated.    Hyperlipidemia Tolerating statin, encouraged heart healthy diet, avoid trans fats, minimize simple carbs and saturated fats. Increase exercise as tolerated   Obesity Encouraged DASH diet, decrease po intake and increase exercise as tolerated. Needs 7-8 hours of sleep nightly. Avoid trans fats, eat small, frequent meals every 4-5 hours with lean proteins, complex carbs and healthy fats. Minimize simple carbs, GMO foods.   Right  shoulder pain Fell on right side last night with pain and decreased ROM, check Xray, ice and Salon pas patches bid to tid and report if persists for referal to sports med   Dermatitis Consider bed bugs but patient has not tried the Permethrin cream yet encouraged to try

## 2014-07-01 NOTE — Assessment & Plan Note (Signed)
Encouraged DASH diet, decrease po intake and increase exercise as tolerated. Needs 7-8 hours of sleep nightly. Avoid trans fats, eat small, frequent meals every 4-5 hours with lean proteins, complex carbs and healthy fats. Minimize simple carbs, GMO foods. 

## 2014-07-01 NOTE — Assessment & Plan Note (Signed)
Patient has not picked up his glucometer yet, is given new prescription check daily and prn. hgba1c is unacceptable, minimize simple carbs. Increase exercise as tolerated. Continue current meds

## 2014-07-01 NOTE — Assessment & Plan Note (Signed)
Tolerating statin, encouraged heart healthy diet, avoid trans fats, minimize simple carbs and saturated fats. Increase exercise as tolerated 

## 2014-07-16 ENCOUNTER — Encounter: Payer: Self-pay | Admitting: Family Medicine

## 2014-07-16 NOTE — Assessment & Plan Note (Signed)
Patient reports his mother and sister have had blood clots and they have been advised family members should be evaluated. Sister with possible Lupus anticoagulant. Will refer to hemaotlogy for further consideration

## 2014-08-25 ENCOUNTER — Other Ambulatory Visit: Payer: Managed Care, Other (non HMO)

## 2014-08-26 ENCOUNTER — Other Ambulatory Visit: Payer: Managed Care, Other (non HMO)

## 2014-09-02 ENCOUNTER — Ambulatory Visit: Payer: Managed Care, Other (non HMO) | Admitting: Family Medicine

## 2014-09-20 ENCOUNTER — Other Ambulatory Visit (INDEPENDENT_AMBULATORY_CARE_PROVIDER_SITE_OTHER): Payer: Managed Care, Other (non HMO)

## 2014-09-20 DIAGNOSIS — E669 Obesity, unspecified: Secondary | ICD-10-CM

## 2014-09-20 DIAGNOSIS — E119 Type 2 diabetes mellitus without complications: Secondary | ICD-10-CM | POA: Diagnosis not present

## 2014-09-20 DIAGNOSIS — E785 Hyperlipidemia, unspecified: Secondary | ICD-10-CM | POA: Diagnosis not present

## 2014-09-20 DIAGNOSIS — I10 Essential (primary) hypertension: Secondary | ICD-10-CM

## 2014-09-20 LAB — COMPREHENSIVE METABOLIC PANEL
ALT: 24 U/L (ref 0–53)
AST: 18 U/L (ref 0–37)
Albumin: 4 g/dL (ref 3.5–5.2)
Alkaline Phosphatase: 68 U/L (ref 39–117)
BUN: 14 mg/dL (ref 6–23)
CHLORIDE: 102 meq/L (ref 96–112)
CO2: 28 meq/L (ref 19–32)
CREATININE: 0.8 mg/dL (ref 0.40–1.50)
Calcium: 9.3 mg/dL (ref 8.4–10.5)
GFR: 110.22 mL/min (ref >60.00)
Glucose, Bld: 158 mg/dL — ABNORMAL HIGH (ref 70–99)
Potassium: 3.9 mEq/L (ref 3.5–5.1)
SODIUM: 137 meq/L (ref 135–145)
Total Bilirubin: 0.8 mg/dL (ref 0.2–1.2)
Total Protein: 6.8 g/dL (ref 6.0–8.3)

## 2014-09-20 LAB — LIPID PANEL
Cholesterol: 180 mg/dL (ref 0–200)
HDL: 34.6 mg/dL — ABNORMAL LOW (ref >39.00)
LDL CALC: 119 mg/dL — AB (ref 0–99)
NonHDL: 145.69
TRIGLYCERIDES: 134 mg/dL (ref 0.0–149.0)
Total CHOL/HDL Ratio: 5
VLDL: 26.8 mg/dL (ref 0.0–40.0)

## 2014-09-20 LAB — CBC
HCT: 41.5 % (ref 39.0–52.0)
Hemoglobin: 14.2 g/dL (ref 13.0–17.0)
MCHC: 34.1 g/dL (ref 30.0–36.0)
MCV: 86.8 fl (ref 78.0–100.0)
Platelets: 264 10*3/uL (ref 150.0–400.0)
RBC: 4.78 Mil/uL (ref 4.22–5.81)
RDW: 13.2 % (ref 11.5–15.5)
WBC: 7.4 10*3/uL (ref 4.0–10.5)

## 2014-09-20 LAB — TSH: TSH: 1.13 u[IU]/mL (ref 0.35–4.50)

## 2014-09-20 LAB — HEMOGLOBIN A1C: HEMOGLOBIN A1C: 7 % — AB (ref 4.6–6.5)

## 2014-09-22 ENCOUNTER — Encounter: Payer: Self-pay | Admitting: Medical

## 2014-09-22 ENCOUNTER — Ambulatory Visit (INDEPENDENT_AMBULATORY_CARE_PROVIDER_SITE_OTHER): Payer: Managed Care, Other (non HMO) | Admitting: Medical

## 2014-09-22 VITALS — BP 135/80 | HR 69 | Temp 98.2°F | Ht 70.0 in | Wt 262.6 lb

## 2014-09-22 DIAGNOSIS — T7840XA Allergy, unspecified, initial encounter: Secondary | ICD-10-CM

## 2014-09-22 DIAGNOSIS — E119 Type 2 diabetes mellitus without complications: Secondary | ICD-10-CM

## 2014-09-22 LAB — GLUCOSE, POCT (MANUAL RESULT ENTRY): POC Glucose: 116 mg/dl — AB (ref 70–99)

## 2014-09-22 MED ORDER — GLIPIZIDE 5 MG PO TABS: 5.0000 mg | ORAL_TABLET | Freq: Every day | ORAL | Status: AC

## 2014-09-22 MED ORDER — PREDNISONE 10 MG PO TABS: 10.0000 mg | ORAL_TABLET | Freq: Every day | ORAL | Status: DC

## 2014-09-22 MED ORDER — METHYLPREDNISOLONE ACETATE 40 MG/ML IJ SUSP
40.0000 mg | Freq: Once | INTRAMUSCULAR | Status: AC
  Administered 2014-09-22: 40 mg via INTRAMUSCULAR

## 2014-09-22 MED ORDER — HYDROXYZINE HCL 25 MG PO TABS: 25.0000 mg | ORAL_TABLET | Freq: Three times a day (TID) | ORAL | Status: DC | PRN

## 2014-09-22 NOTE — Progress Notes (Signed)
Subjective:    Patient ID: Craig Vincent, male    DOB: 1965-04-19, 49 y.o.   MRN: [id number]  HPI  Pt in with some rash recently. Pt states mild rash on back of his hand 2 days ago. His hands were itching. He has been doing a lot yard work. Also some rash on his forehead. Then last night some rash on face. Pt states some exposure to weeds and maybe some poison ivy or oak exposure.  Also some on groin area as well.  This is itching a lot. Pt tried some calamine lotion.  Pt left eye lid puffy and swollen upper lid. No eye matting and no dc.  No wheezing, no sob, no lip, and no tongue swelling. No hx of severe skin sensitivity.   Review of Systems  Constitutional: Negative for fever, chills and fatigue.  Respiratory: Negative for cough, choking, shortness of breath and wheezing.   Cardiovascular: Negative for chest pain and palpitations.  Skin: Positive for rash.       See hpi.  Neurological: Negative for dizziness, seizures, speech difficulty, weakness and headaches.  Hematological: Negative for adenopathy. Does not bruise/bleed easily.  Psychiatric/Behavioral: Negative for behavioral problems and confusion. The patient is not nervous/anxious and is not hyperactive.     Past Medical History  Diagnosis Date  . Diabetes mellitus without complication   . Hyperlipidemia   . Hypertension   . Obesity 05/17/2014  . Dermatitis 07/01/2014    Social History   Social History  . Marital Status: Married    Spouse Name: N/A  . Number of Children: N/A  . Years of Education: 12   Occupational History  . 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 Smoking status: Never Smoker   . Smokeless tobacco: Never Used  . Alcohol Use: No  . Drug Use: No  . 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 Not on file   Social History Narrative     Past Surgical History  Procedure Laterality Date  . Eye surgery    . Inguinal hernia repair    . Hernia repair      Family History  Problem Relation Age of Onset  . Stroke Mother     ?  Marland Kitchen Thrombosis Mother     brain aneurysm  . Clotting disorder Mother     DVTs  . Cancer Father   . Stroke Father     3 strokes  . Cancer Sister     breast  . Cancer Paternal Grandfather     leukemia  . Clotting disorder Sister     lupus anticoagulant positive  . Hyperlipidemia Brother   . Hypertension Brother   . Heart disease Brother     MI    No Known Allergies  Current Outpatient Prescriptions on File Prior to Visit  Medication Sig Dispense Refill  . atorvastatin (LIPITOR) 10 MG tablet Take 1 tablet (10 mg total) by mouth daily. 30 tablet 6  . lisinopril (PRINIVIL,ZESTRIL) 10 MG tablet Take 1 tablet (10 mg total) by mouth 2 (two) times daily. 60 tablet 6  . metFORMIN (GLUCOPHAGE XR) 750 MG 24 hr tablet Take 1 tablet (750 mg total) by mouth 3 (three) times daily. 90 tablet 6  . permethrin (ACTICIN) 5 % cream Apply 1 application topically once. Wash off after  8 hours Repeat treatment in 3-4 weeks if persists 60 g 1   No current facility-administered medications on file prior to visit.    BP 135/80 mmHg  Pulse 69  Temp(Src) 98.2 F (36.8 C) (Oral)  Ht  (1.778 m)  Wt 262 lb 9.6 oz (119.115 kg)  BMI 37.68 kg/m2  SpO2 98%       Objective:   Physical Exam  General  Mental Status - Alert. General Appearance - Well groomed. Not in acute distress.  Skin Rashes-  Rashes. Scattered papular rash hands both sides all the way up to his elbows. Also scattered diffuse papular rash on upper chest. Some rash on left side of face as well.  HEENT Lt eye- upper lid very swollen. But not closed. No matting. Rt eye- normal Mouth- no swelling of mucosa. Tongue normal.  Neck Neck- Supple. No Masses.   Chest and Lung Exam Auscultation: Breath Sounds:- even and  unlabored,  Cardiovascular Auscultation:Rythm- Regular, rate and rhythm. Murmurs & Other Heart Sounds:Ausculatation of the heart reveal- No Murmurs.  Lymphatic Head & Neck General Head & Neck Lymphatics: Bilateral: Description- No Localized lymphadenopathy.       Assessment & Plan:  Will give low dose depomedrol 40 mg im today. Also rx prednisone 10 mg a day for 3 days. Hydroxyzine rx for itching.  Discussed with moderate to severe rash that appears needs treatment more on aggressive side. This could adversely effect blood sugar level which just came back a1-c at 7. In event you get spike of sugars over 180 fasting or 250 post meal  then could add glipizide which I will make available.  Follow up in 7 days or as needed

## 2014-09-22 NOTE — Addendum Note (Signed)
Addended by: Lurline Hare on: 09/22/2014 12:15 PM   Modules accepted: Orders

## 2014-09-22 NOTE — Progress Notes (Signed)
Pre visit review using our clinic review tool, if applicable. No additional management support is needed unless otherwise documented below in the visit note. 

## 2014-09-22 NOTE — Patient Instructions (Addendum)
Will give low dose depomedrol 40 mg im today. Also rx prednisone 10 mg a day for 3 days. Hydroxyzine rx for itching.  Discussed with moderate to severe rash that appears needs treatment more on aggressive side. This could adversely effect blood sugar level which just came back a1-c at 7. In event you get spike of sugars over 180 fasting or 250 post meal  then could add glipizide which I will make available.  Follow up in 7 days or as needed

## 2014-09-27 ENCOUNTER — Encounter: Payer: Self-pay | Admitting: Family Medicine

## 2014-09-27 ENCOUNTER — Ambulatory Visit (INDEPENDENT_AMBULATORY_CARE_PROVIDER_SITE_OTHER): Payer: Managed Care, Other (non HMO) | Admitting: Family Medicine

## 2014-09-27 VITALS — BP 120/82 | HR 83 | Temp 98.1°F | Ht 70.0 in | Wt 260.1 lb

## 2014-09-27 DIAGNOSIS — E785 Hyperlipidemia, unspecified: Secondary | ICD-10-CM | POA: Diagnosis not present

## 2014-09-27 DIAGNOSIS — I10 Essential (primary) hypertension: Secondary | ICD-10-CM

## 2014-09-27 DIAGNOSIS — E119 Type 2 diabetes mellitus without complications: Secondary | ICD-10-CM

## 2014-09-27 DIAGNOSIS — L309 Dermatitis, unspecified: Secondary | ICD-10-CM | POA: Diagnosis not present

## 2014-09-27 DIAGNOSIS — E669 Obesity, unspecified: Secondary | ICD-10-CM

## 2014-09-27 DIAGNOSIS — H547 Unspecified visual loss: Secondary | ICD-10-CM

## 2014-09-27 MED ORDER — PREDNISONE 10 MG PO TABS: 10.0000 mg | ORAL_TABLET | Freq: Two times a day (BID) | ORAL | Status: DC

## 2014-09-27 NOTE — Assessment & Plan Note (Signed)
.  poison ivy, responded partially to prednisone without spike in sugars. Has flared more since stopping, did not need any extra sugar meds to manage. Will allow 10 mg bid x 5 days due to persistent rash and itching.

## 2014-09-27 NOTE — Patient Instructions (Addendum)
Lactaid over the counter take before foods that may cause diarrhea. Check with insurance for TDAP and Prevnar vaccines coverage. Probiotic daily such as [employer] or [facility] are over the counter or online NOW company 10 strain probiotics daily can order at [email or web address]  Basic Carbohydrate Counting for Diabetes Mellitus Carbohydrate counting is a method for keeping track of the amount of carbohydrates you eat. Eating carbohydrates naturally increases the level of sugar (glucose) in your blood, so it is important for you to know the amount that is okay for you to have in every meal. Carbohydrate counting helps keep the level of glucose in your blood within normal limits. The amount of carbohydrates allowed is different for every person. A dietitian can help you calculate the amount that is right for you. Once you know the amount of carbohydrates you can have, you can count the carbohydrates in the foods you want to eat. Carbohydrates are found in the following foods:  Grains, such as breads and cereals.  Dried beans and soy products.  Starchy vegetables, such as potatoes, peas, and corn.  Fruit and fruit juices.  Milk and yogurt.  Sweets and snack foods, such as cake, cookies, candy, chips, soft drinks, and fruit drinks. CARBOHYDRATE COUNTING There are two ways to count the carbohydrates in your food. You can use either of the methods or a combination of both. Reading the "Nutrition Facts" on Packaged Food The "Nutrition Facts" is an area that is included on the labels of almost all packaged food and beverages in the [place]. It includes the serving size of that food or beverage and information about the nutrients in each serving of the food, including the grams (g) of carbohydrate per serving.  Decide the number of servings of this food or beverage that you will be able to eat or drink. Multiply that number of servings by the number of grams of carbohydrate  that is listed on the label for that serving. The total will be the amount of carbohydrates you will be having when you eat or drink this food or beverage. Learning Standard Serving Sizes of Food When you eat food that is not packaged or does not include "Nutrition Facts" on the label, you need to measure the servings in order to count the amount of carbohydrates.A serving of most carbohydrate-rich foods contains about 15 g of carbohydrates. The following list includes serving sizes of carbohydrate-rich foods that provide 15 g ofcarbohydrate per serving:   1 slice of bread (1 oz) or 1 six-inch tortilla.    of a hamburger bun or English muffin.  4-6 crackers.   cup unsweetened dry cereal.    cup hot cereal.   cup rice or pasta.    cup mashed potatoes or  of a large baked potato.  1 cup fresh fruit or one small piece of fruit.    cup canned or frozen fruit or fruit juice.  1 cup milk.   cup plain fat-free yogurt or yogurt sweetened with artificial sweeteners.   cup cooked dried beans or starchy vegetable, such as peas, corn, or potatoes.  Decide the number of standard-size servings that you will eat. Multiply that number of servings by 15 (the grams of carbohydrates in that serving). For example, if you eat 2 cups of strawberries, you will have eaten 2 servings and 30 g of carbohydrates (2 servings x 15 g = 30 g). For foods such as soups and casseroles, in which more than one food is  mixed in, you will need to count the carbohydrates in each food that is included. EXAMPLE OF CARBOHYDRATE COUNTING Sample Dinner  3 oz chicken breast.   cup of brown rice.   cup of corn.  1 cup milk.   1 cup strawberries with sugar-free whipped topping.  Carbohydrate Calculation Step 1: Identify the foods that contain carbohydrates:   Rice.   Corn.   Milk.   Strawberries. Step 2:Calculate the number of servings eaten of each:   2 servings of rice.   1 serving  of corn.   1 serving of milk.   1 serving of strawberries. Step 3: Multiply each of those number of servings by 15 g:   2 servings of rice x 15 g = 30 g.   1 serving of corn x 15 g = 15 g.   1 serving of milk x 15 g = 15 g.   1 serving of strawberries x 15 g = 15 g. Step 4: Add together all of the amounts to find the total grams of carbohydrates eaten: 30 g + 15 g + 15 g + 15 g = 75 g. Document Released: 01/14/2005 Document Revised: 05/31/2013 Document Reviewed: 12/11/2012 [facility] Patient Information 2015 [place], [place]. This information is not intended to replace advice given to you by your health care provider. Make sure you discuss any questions you have with your health care provider.

## 2014-09-27 NOTE — Assessment & Plan Note (Signed)
Tolerating statin, encouraged heart healthy diet, avoid trans fats, minimize simple carbs and saturated fats. Increase exercise as tolerated. Increase Atorvastatin 20 mg qhs and recheck in 3-4 months.

## 2014-09-27 NOTE — Assessment & Plan Note (Signed)
Great improvement in A1C no changes today. hgba1c acceptable, minimize simple carbs. Increase exercise as tolerated. Continue current meds

## 2014-09-27 NOTE — Assessment & Plan Note (Signed)
Encouraged DASH diet, decrease po intake and increase exercise as tolerated. Needs 7-8 hours of sleep nightly. Avoid trans fats, eat small, frequent meals every 4-5 hours with lean proteins, complex carbs and healthy fats. Minimize simple carbs 

## 2014-09-27 NOTE — Progress Notes (Signed)
Patient ID: Craig Vincent, male   DOB: 1965/12/29, 49 y.o.   MRN: [id number]   Subjective:    Patient ID: Craig Vincent, male    DOB: [birth year], 49 y.o.   MRN: [id number]  Chief Complaint  Patient presents with  . Follow-up    HPI Patient is in today for follow up on DM and numerous concerns. Recent bout of poison ivy has been very itchy and irritating but no other acute complaints. Had a partial response to low dose prednisone without a spike in sugars but the Atarax has not been very helpful just made him sleepy. Otherwise he is doing well, no recent illness. Has done a good job of changing his diet and decreasing his carb intake is exercising more. Denies CP/palp/SOB/HA/congestion/fevers/GI or GU c/o. Taking meds as prescribed  Past Medical History  Diagnosis Date  . Diabetes mellitus without complication   . Hyperlipidemia   . Hypertension   . Obesity 05/17/2014  . Dermatitis 07/01/2014    Past Surgical History  Procedure Laterality Date  . Eye surgery    . Inguinal hernia repair    . Hernia repair      Family History  Problem Relation Age of Onset  . Stroke Mother     ?  Marland Kitchen Thrombosis Mother     brain aneurysm  . Clotting disorder Mother     DVTs  . Cancer Father   . Stroke Father     3 strokes  . Cancer Sister     breast  . Cancer Paternal Grandfather     leukemia  . Clotting disorder Sister     lupus anticoagulant positive  . Hyperlipidemia Brother   . Hypertension Brother   . Heart disease Brother     MI    Social History   Social History  . Marital Status: Married    Spouse Name: N/A  . Number of Children: N/A  . Years of Education: 12   Occupational History  . 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 Smoking status: Never Smoker   . Smokeless tobacco: Never Used  . Alcohol Use: No  . Drug Use: No  . 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 Not on file   Social History Narrative    Outpatient Prescriptions Prior to Visit  Medication Sig Dispense Refill  . atorvastatin (LIPITOR) 10 MG tablet Take 1 tablet (10 mg total) by mouth daily. 30 tablet 6  . glipiZIDE (GLUCOTROL) 5 MG tablet Take 1 tablet (5 mg total) by mouth daily before breakfast. 30 tablet 0  . hydrOXYzine (ATARAX/VISTARIL) 25 MG tablet Take 1 tablet (25 mg total) by mouth every 8 (eight) hours as needed for itching. 15 tablet 0  . lisinopril (PRINIVIL,ZESTRIL) 10 MG tablet Take 1 tablet (10 mg total) by mouth 2 (two) times daily. 60 tablet 6  . metFORMIN (GLUCOPHAGE XR) 750 MG 24 hr tablet Take 1 tablet (750 mg total) by mouth 3 (three) times daily. 90 tablet 6  . permethrin (ACTICIN) 5 % cream Apply 1 application topically once. Wash off after 8 hours Repeat treatment in 3-4 weeks if persists 60 g 1  . predniSONE (DELTASONE) 10 MG tablet Take 1 tablet (10 mg total) by mouth daily with breakfast. 3 tablet 0   No facility-administered medications prior to visit.  No Known Allergies  Review of Systems  Constitutional: Negative for fever and malaise/fatigue.  HENT: Negative for congestion.   Eyes: Negative for discharge.  Respiratory: Negative for shortness of breath.   Cardiovascular: Negative for chest pain, palpitations and leg swelling.  Gastrointestinal: Negative for nausea and abdominal pain.  Genitourinary: Negative for dysuria.  Musculoskeletal: Negative for falls.  Skin: Positive for itching and rash.  Neurological: Negative for loss of consciousness and headaches.  Endo/Heme/Allergies: Negative for environmental allergies.  Psychiatric/Behavioral: Negative for depression. The patient is not nervous/anxious.        Objective:    Physical Exam  Constitutional: He is oriented to person, place, and time. He appears well-developed and well-nourished. No distress.  HENT:  Head:  Normocephalic and atraumatic.  Nose: Nose normal.  Eyes: Right eye exhibits no discharge. Left eye exhibits no discharge.  Neck: Normal range of motion. Neck supple.  Cardiovascular: Normal rate and regular rhythm.   No murmur heard. Pulmonary/Chest: Effort normal and breath sounds normal.  Abdominal: Soft. Bowel sounds are normal. There is no tenderness.  Musculoskeletal: He exhibits no edema.  Neurological: He is alert and oriented to person, place, and time.  Skin: Skin is warm and dry. Rash noted. There is erythema.  Diffuse erythematous, maculopapular lesions b/l arms  Psychiatric: He has a normal mood and affect.  Nursing note and vitals reviewed.   BP 120/82 mmHg  Pulse 83  Temp(Src) 98.1 F (36.7 C) (Oral)  Ht 5\' 10"  (1.778 m)  Wt 260 lb 2 oz (117.992 kg)  BMI 37.32 kg/m2  SpO2 96% Wt Readings from Last 3 Encounters:  09/27/14 260 lb 2 oz (117.992 kg)  09/22/14 262 lb 9.6 oz (119.115 kg)  07/01/14 267 lb 6 oz (121.281 kg)     Lab Results  Component Value Date   WBC 7.4 09/20/2014   HGB 14.2 09/20/2014   HCT 41.5 09/20/2014   PLT 264.0 09/20/2014   GLUCOSE 158* 09/20/2014   CHOL 180 09/20/2014   TRIG 134.0 09/20/2014   HDL 34.60* 09/20/2014   LDLCALC 119* 09/20/2014   ALT 24 09/20/2014   AST 18 09/20/2014   NA 137 09/20/2014   K 3.9 09/20/2014   CL 102 09/20/2014   CREATININE 0.80 09/20/2014   BUN 14 09/20/2014   CO2 28 09/20/2014   TSH 1.13 09/20/2014   HGBA1C 7.0* 09/20/2014    Lab Results  Component Value Date   TSH 1.13 09/20/2014   Lab Results  Component Value Date   WBC 7.4 09/20/2014   HGB 14.2 09/20/2014   HCT 41.5 09/20/2014   MCV 86.8 09/20/2014   PLT 264.0 09/20/2014   Lab Results  Component Value Date   NA 137 09/20/2014   K 3.9 09/20/2014   CO2 28 09/20/2014   GLUCOSE 158* 09/20/2014   BUN 14 09/20/2014   CREATININE 0.80 09/20/2014   BILITOT 0.8 09/20/2014   ALKPHOS 68 09/20/2014   AST 18 09/20/2014   ALT 24 09/20/2014     PROT 6.8 09/20/2014   ALBUMIN 4.0 09/20/2014   CALCIUM 9.3 09/20/2014   GFR 110.22 09/20/2014   Lab Results  Component Value Date   CHOL 180 09/20/2014   Lab Results  Component Value Date   HDL 34.60* 09/20/2014   Lab Results  Component Value Date   LDLCALC 119* 09/20/2014   Lab Results  Component Value Date   TRIG 134.0 09/20/2014   Lab Results  Component Value Date   CHOLHDL 5 09/20/2014  Lab Results  Component Value Date   HGBA1C 7.0* 09/20/2014       Assessment & Plan:  Hyperlipidemia Tolerating statin, encouraged heart healthy diet, avoid trans fats, minimize simple carbs and saturated fats. Increase exercise as tolerated. Increase Atorvastatin 20 mg qhs and recheck in 3-4 months.  Hypertension Well controlled, no changes to meds. Encouraged heart healthy diet such as the DASH diet and exercise as tolerated.   Obesity Encouraged DASH diet, decrease po intake and increase exercise as tolerated. Needs 7-8 hours of sleep nightly. Avoid trans fats, eat small, frequent meals every 4-5 hours with lean proteins, complex carbs and healthy fats. Minimize simple carbs.  Diabetes mellitus without complication Great improvement in A1C no changes today. hgba1c acceptable, minimize simple carbs. Increase exercise as tolerated. Continue current meds  Rash and nonspecific skin eruption .poison ivy, responded partially to prednisone without spike in sugars. Has flared more since stopping, did not need any extra sugar meds to manage. Will allow 10 mg bid x 5 days due to persistent rash and itching.   Dermatitis Poison ivy, giiven Prednisone and hydroxyzine. Witch Hazel prn and wash with Dawn dish washing liquid   I have discontinued Mr. Scheidegger's permethrin and predniSONE. I am also having him maintain his atorvastatin, lisinopril, metFORMIN, glipiZIDE, and hydrOXYzine.  No orders of the defined types were placed in this encounter.     Baird Kay, LPN

## 2014-09-27 NOTE — Assessment & Plan Note (Signed)
Well controlled, no changes to meds. Encouraged heart healthy diet such as the DASH diet and exercise as tolerated.  °

## 2014-09-27 NOTE — Progress Notes (Signed)
Pre visit review using our clinic review tool, if applicable. No additional management support is needed unless otherwise documented below in the visit note. 

## 2014-10-16 NOTE — Assessment & Plan Note (Addendum)
Poison ivy, giiven Prednisone and hydroxyzine. Witch Hazel prn and wash with Dawn dish washing liquid

## 2014-10-24 ENCOUNTER — Encounter: Payer: Self-pay | Admitting: Family Medicine

## 2014-10-24 ENCOUNTER — Telehealth: Payer: Self-pay | Admitting: Family Medicine

## 2014-10-24 NOTE — Telephone Encounter (Signed)
Caller name:Maya Relationship to patient:self Can be reached:[phone] Pharmacy:[facility]  Reason for call on his last visit he understood to up his lipitor to 2 pills perday.  Which is what he has been doing.  Came to pick up his refill and it still shows 1 per day.  What do you want him to do

## 2014-10-24 NOTE — Telephone Encounter (Signed)
Please send in Atorvastatin 20 mg po qhs disp 90 with 3 rf

## 2014-10-25 ENCOUNTER — Other Ambulatory Visit: Payer: Self-pay | Admitting: Family Medicine

## 2014-10-25 MED ORDER — ATORVASTATIN CALCIUM 20 MG PO TABS: 20.0000 mg | ORAL_TABLET | Freq: Every day | ORAL | Status: DC

## 2014-10-25 NOTE — Telephone Encounter (Signed)
Done as instructed.  The patient was contacted by Earleen Reaper and a telephone message prescription requested has been done.

## 2014-12-13 ENCOUNTER — Ambulatory Visit: Payer: Managed Care, Other (non HMO)

## 2014-12-20 ENCOUNTER — Ambulatory Visit: Payer: Managed Care, Other (non HMO)

## 2014-12-27 ENCOUNTER — Ambulatory Visit: Payer: Managed Care, Other (non HMO) | Admitting: Family Medicine

## 2015-02-21 MED FILL — METFORMIN HCL ER 750 MG TAB: 750 | 30 days supply | Qty: 90 | Fill #6

## 2015-02-21 MED FILL — FREESTYLE LANCETS: 30 days supply | Qty: 100 | Fill #2

## 2015-02-24 ENCOUNTER — Telehealth: Payer: Self-pay | Admitting: *Deleted

## 2015-02-24 MED FILL — FREESTYLE LITE TEST STRIP: 30 days supply | Qty: 100 | Fill #2

## 2015-02-24 NOTE — Telephone Encounter (Signed)
Received Authorization Notification via fax; faxed to pharmacy at [phone], sent to scan/SLS 01/27

## 2015-02-24 NOTE — Telephone Encounter (Signed)
Received fax from pharmacy requesting PA for FreeStyle Lite Test Strips; Initiated via Cover My Meds, awaiting response/SLS 01/27

## 2015-02-24 NOTE — Telephone Encounter (Signed)
Caller name: Clydie Braun from [employer]   Call back number: [phone]   Reason for cal: As per [employer] the below prior auth has been approved reference # [id number]

## 2015-03-06 MED FILL — ATORVASTATIN 20 MG TABLET: 20 | 90 days supply | Qty: 90 | Fill #1

## 2015-03-21 ENCOUNTER — Encounter: Payer: Self-pay | Admitting: Family Medicine

## 2015-03-22 ENCOUNTER — Other Ambulatory Visit: Payer: Self-pay | Admitting: Family Medicine

## 2015-03-22 DIAGNOSIS — E119 Type 2 diabetes mellitus without complications: Secondary | ICD-10-CM

## 2015-03-22 DIAGNOSIS — I10 Essential (primary) hypertension: Secondary | ICD-10-CM

## 2015-03-22 DIAGNOSIS — E669 Obesity, unspecified: Secondary | ICD-10-CM

## 2015-03-22 DIAGNOSIS — E785 Hyperlipidemia, unspecified: Secondary | ICD-10-CM

## 2015-03-22 DIAGNOSIS — R21 Rash and other nonspecific skin eruption: Secondary | ICD-10-CM

## 2015-03-22 MED ORDER — METFORMIN HCL ER 750 MG PO TB24: 750.0000 mg | ORAL_TABLET | Freq: Three times a day (TID) | ORAL | Status: DC

## 2015-03-22 MED ORDER — LISINOPRIL 10 MG PO TABS: 10.0000 mg | ORAL_TABLET | Freq: Two times a day (BID) | ORAL | Status: DC

## 2015-04-06 MED FILL — LISINOPRIL 10 MG TABLET: 10 | 30 days supply | Qty: 60 | Fill #0

## 2015-04-06 MED FILL — METFORMIN HCL ER 750 MG TAB: 750 | 30 days supply | Qty: 90 | Fill #0

## 2015-04-07 ENCOUNTER — Telehealth: Payer: Self-pay | Admitting: Family Medicine

## 2015-04-07 NOTE — Telephone Encounter (Signed)
Pt states will call for FLU SHOT appt once he gets insurance information straight out.

## 2015-05-08 MED FILL — METFORMIN HCL ER 750 MG TAB: 750 | 30 days supply | Qty: 90 | Fill #1

## 2015-05-08 MED FILL — LISINOPRIL 10 MG TABLET: 10 | 30 days supply | Qty: 60 | Fill #1

## 2015-05-11 ENCOUNTER — Encounter: Payer: Self-pay | Admitting: Family Medicine

## 2015-05-11 DIAGNOSIS — E669 Obesity, unspecified: Secondary | ICD-10-CM

## 2015-05-11 DIAGNOSIS — E1169 Type 2 diabetes mellitus with other specified complication: Secondary | ICD-10-CM

## 2015-05-11 DIAGNOSIS — E119 Type 2 diabetes mellitus without complications: Secondary | ICD-10-CM

## 2015-05-11 DIAGNOSIS — E785 Hyperlipidemia, unspecified: Secondary | ICD-10-CM

## 2015-05-11 DIAGNOSIS — I10 Essential (primary) hypertension: Secondary | ICD-10-CM

## 2015-05-15 NOTE — Telephone Encounter (Signed)
Labs have been ordered for before next visit.

## 2015-06-08 ENCOUNTER — Other Ambulatory Visit: Payer: Self-pay | Admitting: Family Medicine

## 2015-06-08 MED FILL — METFORMIN HCL ER 750 MG TAB: 750 | 30 days supply | Qty: 90 | Fill #2

## 2015-06-08 MED FILL — LISINOPRIL 10 MG TABLET: 10 | 30 days supply | Qty: 60 | Fill #2

## 2015-06-08 MED FILL — FREESTYLE LANCETS: 30 days supply | Qty: 100 | Fill #3

## 2015-06-09 ENCOUNTER — Other Ambulatory Visit (INDEPENDENT_AMBULATORY_CARE_PROVIDER_SITE_OTHER): Payer: Managed Care, Other (non HMO)

## 2015-06-09 DIAGNOSIS — E669 Obesity, unspecified: Secondary | ICD-10-CM

## 2015-06-09 DIAGNOSIS — E119 Type 2 diabetes mellitus without complications: Secondary | ICD-10-CM | POA: Diagnosis not present

## 2015-06-09 DIAGNOSIS — E785 Hyperlipidemia, unspecified: Secondary | ICD-10-CM | POA: Diagnosis not present

## 2015-06-09 DIAGNOSIS — I10 Essential (primary) hypertension: Secondary | ICD-10-CM

## 2015-06-09 DIAGNOSIS — E1169 Type 2 diabetes mellitus with other specified complication: Secondary | ICD-10-CM

## 2015-06-09 LAB — MICROALBUMIN / CREATININE URINE RATIO
Creatinine,U: 152.9 mg/dL
Microalb Creat Ratio: 0.5 mg/g (ref 0.0–30.0)
Microalb, Ur: <0.7 mg/dL (ref 0.0–1.9)

## 2015-06-09 LAB — CBC
HEMATOCRIT: 42.9 % (ref 39.0–52.0)
HEMOGLOBIN: 14.6 g/dL (ref 13.0–17.0)
MCHC: 34.2 g/dL (ref 30.0–36.0)
MCV: 86 fl (ref 78.0–100.0)
Platelets: 282 10*3/uL (ref 150.0–400.0)
RBC: 4.99 Mil/uL (ref 4.22–5.81)
RDW: 13.1 % (ref 11.5–15.5)
WBC: 6.8 10*3/uL (ref 4.0–10.5)

## 2015-06-09 LAB — COMPREHENSIVE METABOLIC PANEL
ALBUMIN: 4.2 g/dL (ref 3.5–5.2)
ALK PHOS: 74 U/L (ref 39–117)
ALT: 36 U/L (ref 0–53)
AST: 25 U/L (ref 0–37)
BUN: 11 mg/dL (ref 6–23)
CHLORIDE: 104 meq/L (ref 96–112)
CO2: 24 mEq/L (ref 19–32)
CREATININE: 0.85 mg/dL (ref 0.40–1.50)
Calcium: 9.5 mg/dL (ref 8.4–10.5)
GFR: 102.46 mL/min (ref >60.00)
Glucose, Bld: 129 mg/dL — ABNORMAL HIGH (ref 70–99)
Potassium: 4.1 mEq/L (ref 3.5–5.1)
SODIUM: 138 meq/L (ref 135–145)
TOTAL PROTEIN: 6.8 g/dL (ref 6.0–8.3)
Total Bilirubin: 0.7 mg/dL (ref 0.2–1.2)

## 2015-06-09 LAB — LIPID PANEL
Cholesterol: 126 mg/dL (ref 0–200)
HDL: 30.4 mg/dL — AB (ref >39.00)
LDL CALC: 73 mg/dL (ref 0–99)
NONHDL: 95.99
Total CHOL/HDL Ratio: 4
Triglycerides: 115 mg/dL (ref 0.0–149.0)
VLDL: 23 mg/dL (ref 0.0–40.0)

## 2015-06-09 LAB — HEMOGLOBIN A1C: HEMOGLOBIN A1C: 6.8 % — AB (ref 4.6–6.5)

## 2015-06-09 MED FILL — ATORVASTATIN 20 MG TABLET: 20 | 90 days supply | Qty: 90 | Fill #0

## 2015-06-09 MED FILL — FREESTYLE LITE TEST STRIP: 30 days supply | Qty: 100 | Fill #3

## 2015-06-16 ENCOUNTER — Encounter: Payer: Self-pay | Admitting: Family Medicine

## 2015-06-16 ENCOUNTER — Ambulatory Visit (INDEPENDENT_AMBULATORY_CARE_PROVIDER_SITE_OTHER): Payer: Managed Care, Other (non HMO) | Admitting: Family Medicine

## 2015-06-16 VITALS — BP 108/68 | HR 81 | Temp 98.4°F | Ht 70.0 in | Wt 256.2 lb

## 2015-06-16 DIAGNOSIS — L309 Dermatitis, unspecified: Secondary | ICD-10-CM

## 2015-06-16 DIAGNOSIS — E119 Type 2 diabetes mellitus without complications: Secondary | ICD-10-CM | POA: Diagnosis not present

## 2015-06-16 DIAGNOSIS — E785 Hyperlipidemia, unspecified: Secondary | ICD-10-CM | POA: Diagnosis not present

## 2015-06-16 DIAGNOSIS — I10 Essential (primary) hypertension: Secondary | ICD-10-CM | POA: Diagnosis not present

## 2015-06-16 DIAGNOSIS — E669 Obesity, unspecified: Secondary | ICD-10-CM

## 2015-06-16 NOTE — Assessment & Plan Note (Signed)
Just got a new batch of poison ivy and has just had a course of prednisone and is doing better

## 2015-06-16 NOTE — Assessment & Plan Note (Signed)
Tolerating statin, encouraged heart healthy diet, avoid trans fats, minimize simple carbs and saturated fats. Increase exercise as tolerated 

## 2015-06-16 NOTE — Assessment & Plan Note (Signed)
hgba1c acceptable, minimize simple carbs. Increase exercise as tolerated. Continue current meds 

## 2015-06-16 NOTE — Assessment & Plan Note (Signed)
Well controlled, no changes to meds. Encouraged heart healthy diet such as the DASH diet and exercise as tolerated.  °

## 2015-06-16 NOTE — Progress Notes (Signed)
Subjective:    Patient ID: Craig Vincent, male    DOB: [birth year], 50 y.o.   MRN: [id number]  Chief Complaint  Patient presents with  . Follow-up    HPI Patient is in today for follow up. He feels well today. He is noting a recent episode of poison ivy requiring steroids. His rash has resolved and his prednisone is completed but his sugars are still up between 130 to 150s even in the morning. No polyuria or polydipsia. Does have some Glipizide on hand but has not used it yet. Used it in past when he took Prednisone. Denies CP/palp/SOB/HA/congestion/fevers/GI or GU c/o. Taking meds as prescribed  Past Medical History  Diagnosis Date  . Diabetes mellitus without complication (HCC)   . Hyperlipidemia   . Hypertension   . Obesity 05/17/2014  . Dermatitis 07/01/2014    Past Surgical History  Procedure Laterality Date  . Eye surgery    . Inguinal hernia repair    . Hernia repair      Family History  Problem Relation Age of Onset  . Stroke Mother     ?  Marland Kitchen Thrombosis Mother     brain aneurysm  . Clotting disorder Mother     DVTs  . Cancer Father   . Stroke Father     3 strokes  . Cancer Sister     breast  . Cancer Paternal Grandfather     leukemia  . Clotting disorder Sister     lupus anticoagulant positive  . Hyperlipidemia Brother   . Hypertension Brother   . Heart disease Brother     MI    Social History   Social History  . Marital Status: Married    Spouse Name: N/A  . Number of Children: N/A  . Years of Education: 12   Occupational History  . 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 Smoking status: Never Smoker   . Smokeless tobacco: Never Used  . Alcohol Use: No  . Drug Use: No  . 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 Not on file   Social History Narrative    Outpatient Prescriptions  Prior to Visit  Medication Sig Dispense Refill  . atorvastatin (LIPITOR) 20 MG tablet TAKE 1 TABLET (20 MG TOTAL) BY MOUTH DAILY. 90 tablet 0  . glipiZIDE (GLUCOTROL) 5 MG tablet Take 1 tablet (5 mg total) by mouth daily before breakfast. 30 tablet 0  . lisinopril (PRINIVIL,ZESTRIL) 10 MG tablet Take 1 tablet (10 mg total) by mouth 2 (two) times daily. 60 tablet 2  . metFORMIN (GLUCOPHAGE XR) 750 MG 24 hr tablet Take 1 tablet (750 mg total) by mouth 3 (three) times daily. 90 tablet 2  . hydrOXYzine (ATARAX/VISTARIL) 25 MG tablet Take 1 tablet (25 mg total) by mouth every 8 (eight) hours as needed for itching. 15 tablet 0  . predniSONE (DELTASONE) 10 MG tablet Take 1 tablet (10 mg total) by mouth 2 (two) times daily with a meal. 10 tablet 0   No facility-administered medications prior to visit.    No Known Allergies  Review of Systems  Constitutional: Negative for fever and malaise/fatigue.  HENT: Negative for congestion.   Eyes: Negative for blurred vision.  Respiratory: Negative for shortness of breath.   Cardiovascular: Negative for chest pain, palpitations and leg swelling.  Gastrointestinal: Negative for nausea, abdominal pain and blood in stool.  Genitourinary: Negative for dysuria and frequency.  Musculoskeletal: Negative for falls.  Skin: Positive for itching and rash.  Neurological: Negative for dizziness, loss of consciousness and headaches.  Endo/Heme/Allergies: Negative for environmental allergies.  Psychiatric/Behavioral: Negative for depression. The patient is not nervous/anxious.        Objective:    Physical Exam  Constitutional: He is oriented to person, place, and time. He appears well-developed and well-nourished. No distress.  HENT:  Head: Normocephalic and atraumatic.  Nose: Nose normal.  Eyes: Right eye exhibits no discharge. Left eye exhibits no discharge.  Neck: Normal range of motion. Neck supple.  Cardiovascular: Normal rate and regular rhythm.   No  murmur heard. Pulmonary/Chest: Effort normal and breath sounds normal.  Abdominal: Soft. Bowel sounds are normal. There is no tenderness.  Musculoskeletal: He exhibits no edema.  Neurological: He is alert and oriented to person, place, and time.  Skin: Skin is warm and dry.  Psychiatric: He has a normal mood and affect.  Nursing note and vitals reviewed.   BP 108/68 mmHg  Pulse 81  Temp(Src) 98.4 F (36.9 C) (Oral)  Ht 5\' 10"  (1.778 m)  Wt 256 lb 4 oz (116.234 kg)  BMI 36.77 kg/m2  SpO2 95% Wt Readings from Last 3 Encounters:  06/16/15 256 lb 4 oz (116.234 kg)  09/27/14 260 lb 2 oz (117.992 kg)  09/22/14 262 lb 9.6 oz (119.115 kg)     Lab Results  Component Value Date   WBC 6.8 06/09/2015   HGB 14.6 06/09/2015   HCT 42.9 06/09/2015   PLT 282.0 06/09/2015   GLUCOSE 129* 06/09/2015   CHOL 126 06/09/2015   TRIG 115.0 06/09/2015   HDL 30.40* 06/09/2015   LDLCALC 73 06/09/2015   ALT 36 06/09/2015   AST 25 06/09/2015   NA 138 06/09/2015   K 4.1 06/09/2015   CL 104 06/09/2015   CREATININE 0.85 06/09/2015   BUN 11 06/09/2015   CO2 24 06/09/2015   TSH 1.13 09/20/2014   HGBA1C 6.8* 06/09/2015   MICROALBUR <0.7 06/09/2015    Lab Results  Component Value Date   TSH 1.13 09/20/2014   Lab Results  Component Value Date   WBC 6.8 06/09/2015   HGB 14.6 06/09/2015   HCT 42.9 06/09/2015   MCV 86.0 06/09/2015   PLT 282.0 06/09/2015   Lab Results  Component Value Date   NA 138 06/09/2015   K 4.1 06/09/2015   CO2 24 06/09/2015   GLUCOSE 129* 06/09/2015   BUN 11 06/09/2015   CREATININE 0.85 06/09/2015   BILITOT 0.7 06/09/2015   ALKPHOS 74 06/09/2015   AST 25 06/09/2015   ALT 36 06/09/2015   PROT 6.8 06/09/2015   ALBUMIN 4.2 06/09/2015   CALCIUM 9.5 06/09/2015   GFR 102.46 06/09/2015   Lab Results  Component Value Date   CHOL 126 06/09/2015   Lab Results  Component Value Date   HDL 30.40* 06/09/2015   Lab Results  Component Value Date   LDLCALC 73  06/09/2015   Lab Results  Component Value Date   TRIG 115.0 06/09/2015   Lab Results  Component Value Date   CHOLHDL 4 06/09/2015   Lab Results  Component Value Date   HGBA1C 6.8* 06/09/2015       Assessment & Plan:   Problem List Items Addressed This Visit    Obesity    Encouraged DASH diet, decrease po intake and increase exercise  as tolerated. Needs 7-8 hours of sleep nightly. Avoid trans fats, eat small, frequent meals every 4-5 hours with lean proteins, complex carbs and healthy fats. Minimize simple carbs      Hypertension - Primary    Well controlled, no changes to meds. Encouraged heart healthy diet such as the DASH diet and exercise as tolerated.       Hyperlipidemia    Tolerating statin, encouraged heart healthy diet, avoid trans fats, minimize simple carbs and saturated fats. Increase exercise as tolerated      Diabetes mellitus without complication (HCC)    hgba1c acceptable, minimize simple carbs. Increase exercise as tolerated. Continue current meds      Dermatitis    Just got a new batch of poison ivy and has just had a course of prednisone and is doing better         I have discontinued Mr. Uhlig's hydrOXYzine and predniSONE. I am also having him maintain his glipiZIDE, lisinopril, metFORMIN, and atorvastatin.  No orders of the defined types were placed in this encounter.     Danise Edge, MD

## 2015-06-16 NOTE — Progress Notes (Signed)
Pre visit review using our clinic review tool, if applicable. No additional management support is needed unless otherwise documented below in the visit note. 

## 2015-06-16 NOTE — Assessment & Plan Note (Signed)
Encouraged DASH diet, decrease po intake and increase exercise as tolerated. Needs 7-8 hours of sleep nightly. Avoid trans fats, eat small, frequent meals every 4-5 hours with lean proteins, complex carbs and healthy fats. Minimize simple carbs 

## 2015-06-16 NOTE — Patient Instructions (Addendum)
NOW probiotics 10 strain cap daily [email or web address]  Basic Carbohydrate Counting for Diabetes Mellitus Carbohydrate counting is a method for keeping track of the amount of carbohydrates you eat. Eating carbohydrates naturally increases the level of sugar (glucose) in your blood, so it is important for you to know the amount that is okay for you to have in every meal. Carbohydrate counting helps keep the level of glucose in your blood within normal limits. The amount of carbohydrates allowed is different for every person. A dietitian can help you calculate the amount that is right for you. Once you know the amount of carbohydrates you can have, you can count the carbohydrates in the foods you want to eat. Carbohydrates are found in the following foods:  Grains, such as breads and cereals.  Dried beans and soy products.  Starchy vegetables, such as potatoes, peas, and corn.  Fruit and fruit juices.  Milk and yogurt.  Sweets and snack foods, such as cake, cookies, candy, chips, soft drinks, and fruit drinks. CARBOHYDRATE COUNTING There are two ways to count the carbohydrates in your food. You can use either of the methods or a combination of both. Reading the "Nutrition Facts" on Packaged Food The "Nutrition Facts" is an area that is included on the labels of almost all packaged food and beverages in the [place]. It includes the serving size of that food or beverage and information about the nutrients in each serving of the food, including the grams (g) of carbohydrate per serving.  Decide the number of servings of this food or beverage that you will be able to eat or drink. Multiply that number of servings by the number of grams of carbohydrate that is listed on the label for that serving. The total will be the amount of carbohydrates you will be having when you eat or drink this food or beverage. Learning Standard Serving Sizes of Food When you eat food that is not packaged or does not  include "Nutrition Facts" on the label, you need to measure the servings in order to count the amount of carbohydrates.A serving of most carbohydrate-rich foods contains about 15 g of carbohydrates. The following list includes serving sizes of carbohydrate-rich foods that provide 15 g ofcarbohydrate per serving:   1 slice of bread (1 oz) or 1 six-inch tortilla.    of a hamburger bun or English muffin.  4-6 crackers.   cup unsweetened dry cereal.    cup hot cereal.   cup rice or pasta.    cup mashed potatoes or  of a large baked potato.  1 cup fresh fruit or one small piece of fruit.    cup canned or frozen fruit or fruit juice.  1 cup milk.   cup plain fat-free yogurt or yogurt sweetened with artificial sweeteners.   cup cooked dried beans or starchy vegetable, such as peas, corn, or potatoes.  Decide the number of standard-size servings that you will eat. Multiply that number of servings by 15 (the grams of carbohydrates in that serving). For example, if you eat 2 cups of strawberries, you will have eaten 2 servings and 30 g of carbohydrates (2 servings x 15 g = 30 g). For foods such as soups and casseroles, in which more than one food is mixed in, you will need to count the carbohydrates in each food that is included. EXAMPLE OF CARBOHYDRATE COUNTING Sample Dinner  3 oz chicken breast.   cup of brown rice.   cup of corn.  1  cup milk.   1 cup strawberries with sugar-free whipped topping.  Carbohydrate Calculation Step 1: Identify the foods that contain carbohydrates:   Rice.   Corn.   Milk.   Strawberries. Step 2:Calculate the number of servings eaten of each:   2 servings of rice.   1 serving of corn.   1 serving of milk.   1 serving of strawberries. Step 3: Multiply each of those number of servings by 15 g:   2 servings of rice x 15 g = 30 g.   1 serving of corn x 15 g = 15 g.   1 serving of milk x 15 g = 15 g.   1  serving of strawberries x 15 g = 15 g. Step 4: Add together all of the amounts to find the total grams of carbohydrates eaten: 30 g + 15 g + 15 g + 15 g = 75 g.   This information is not intended to replace advice given to you by your health care provider. Make sure you discuss any questions you have with your health care provider.   Document Released: 01/14/2005 Document Revised: 02/04/2014 Document Reviewed: 12/11/2012 Elsevier Interactive Patient Education 2016 Elsevier Inc. Rosacea Rosacea is a long-term (chronic) condition that affects the skin of the face, including the cheeks, nose, brow, and chin. This condition can also affect the eyes. Rosacea causes blood vessels near the surface of the skin to enlarge, which results in redness. CAUSES The cause of this condition is not known. Certain triggers can make rosacea worse, including:  Hot baths.  Exercise.  Sunlight.  Very hot or cold temperatures.  Hot or spicy foods and drinks.  Drinking alcohol.  Stress.  Taking blood pressure medicine.  Long-term use of topical steroids on the face. RISK FACTORS This condition is more likely to develop in:  People who are older than 48 years of age.  Women.  People who have light-colored skin (light complexion).  People who have a family history of rosacea. SYMPTOMS  Symptoms of this condition include:  Redness of the face.  Red bumps or pimples on the face.  A red, enlarged nose.  Blushing easily.  Red lines on the skin.  Irritated or burning feeling in the eyes.  Swollen eyelids. DIAGNOSIS This condition is diagnosed with a medical history and physical exam. TREATMENT There is no cure for this condition, but treatment can help to control your symptoms. Your health care provider may recommend that you see a skin specialist (dermatologist). Treatment may include:  Antibiotic medicines that are applied to the skin or taken as a pill.  Laser treatment to improve  the appearance of the skin.  Surgery. This is rare. Your health care provider will also recommend the best way to take care of your skin. Even after your skin improves, you will likely need to continue treatment to prevent your rosacea from coming back. HOME CARE INSTRUCTIONS Skin Care Take care of your skin as told by your health care provider. You may be told to do these things:  Wash your skin gently two or more times each day.  Use mild soap.  Use a sunscreen or sunblock with SPF 30 or greater.  Use gentle cosmetics that are meant for sensitive skin.  Shave with an electric shaver instead of a blade. Lifestyle  Try to keep track of what foods trigger this condition. Avoid any triggers. These may include:  Spicy foods.  Seafood.  Cheese.  Hot liquids.  Nuts.  Chocolate.  Iodized salt.  Do not drink alcohol.  Avoid extremely cold or hot temperatures.  Try to reduce your stress. If you need help, talk with your health care provider.  When you exercise, do these things to stay cool:  Limit your sun exposure.  Use a fan.  Do shorter and more frequent intervals of exercise. General Instructions   Keep all follow-up visits as told by your health care provider. This is important.  Take over-the-counter and prescription medicines only as told by your health care provider.  If your eyelids are affected, apply warm compresses to them. Do this as told by your health care provider.  If you were prescribed an antibiotic medicine, apply or take it as told by your health care provider. Do not stop using the antibiotic even if your condition improves. SEEK MEDICAL CARE IF:  Your symptoms get worse.  Your symptoms do not improve after two months of treatment.  You have new symptoms.  You have any changes in vision or you have problems with your eyes, such as redness or itching.  You feel depressed.  You lose your appetite.  You have trouble concentrating.    This information is not intended to replace advice given to you by your health care provider. Make sure you discuss any questions you have with your health care provider.   Document Released: 02/22/2004 Document Revised: 10/05/2014 Document Reviewed: 03/23/2014 Elsevier Interactive Patient Education [employer]2016 Elsevier Inc.

## 2015-07-17 ENCOUNTER — Encounter: Payer: Self-pay | Admitting: Family Medicine

## 2015-07-18 ENCOUNTER — Other Ambulatory Visit: Payer: Self-pay | Admitting: Family Medicine

## 2015-07-18 DIAGNOSIS — R21 Rash and other nonspecific skin eruption: Secondary | ICD-10-CM

## 2015-07-18 DIAGNOSIS — E785 Hyperlipidemia, unspecified: Secondary | ICD-10-CM

## 2015-07-18 DIAGNOSIS — E119 Type 2 diabetes mellitus without complications: Secondary | ICD-10-CM

## 2015-07-18 DIAGNOSIS — I10 Essential (primary) hypertension: Secondary | ICD-10-CM

## 2015-07-18 DIAGNOSIS — E669 Obesity, unspecified: Secondary | ICD-10-CM

## 2015-07-18 MED ORDER — LISINOPRIL 10 MG PO TABS: 10.0000 mg | ORAL_TABLET | Freq: Two times a day (BID) | ORAL | Status: DC

## 2015-07-18 MED ORDER — METFORMIN HCL ER 750 MG PO TB24: 750.0000 mg | ORAL_TABLET | Freq: Three times a day (TID) | ORAL | Status: DC

## 2015-09-08 ENCOUNTER — Other Ambulatory Visit: Payer: Self-pay | Admitting: Family Medicine

## 2015-09-08 ENCOUNTER — Encounter: Payer: Self-pay | Admitting: Family Medicine

## 2015-09-08 MED ORDER — ATORVASTATIN CALCIUM 20 MG PO TABS
20.0000 mg | ORAL_TABLET | Freq: Every day | ORAL | 1 refills | Status: DC
Start: 2015-09-08 — End: 2015-09-08

## 2015-09-08 MED ORDER — ATORVASTATIN CALCIUM 20 MG PO TABS: 20.0000 mg | ORAL_TABLET | Freq: Every day | ORAL | 1 refills | Status: AC

## 2015-10-17 LAB — HM DIABETES EYE EXAM

## 2015-10-19 ENCOUNTER — Other Ambulatory Visit: Payer: Self-pay | Admitting: Family Medicine

## 2015-10-19 ENCOUNTER — Encounter: Payer: Self-pay | Admitting: Family Medicine

## 2015-10-19 MED ORDER — GLUCOSE BLOOD VI STRP: ORAL_STRIP | 6 refills | Status: AC

## 2015-10-19 MED ORDER — FREESTYLE LANCETS MISC: 6 refills | Status: AC

## 2015-11-08 ENCOUNTER — Encounter: Payer: Self-pay | Admitting: Family Medicine

## 2015-12-11 ENCOUNTER — Other Ambulatory Visit (INDEPENDENT_AMBULATORY_CARE_PROVIDER_SITE_OTHER): Payer: Managed Care, Other (non HMO)

## 2015-12-11 DIAGNOSIS — I10 Essential (primary) hypertension: Secondary | ICD-10-CM

## 2015-12-11 DIAGNOSIS — E785 Hyperlipidemia, unspecified: Secondary | ICD-10-CM

## 2015-12-11 DIAGNOSIS — E669 Obesity, unspecified: Secondary | ICD-10-CM

## 2015-12-11 DIAGNOSIS — L309 Dermatitis, unspecified: Secondary | ICD-10-CM

## 2015-12-11 DIAGNOSIS — E119 Type 2 diabetes mellitus without complications: Secondary | ICD-10-CM

## 2015-12-11 LAB — COMPREHENSIVE METABOLIC PANEL
ALK PHOS: 75 U/L (ref 39–117)
ALT: 32 U/L (ref 0–53)
AST: 22 U/L (ref 0–37)
Albumin: 4.1 g/dL (ref 3.5–5.2)
BUN: 14 mg/dL (ref 6–23)
CO2: 27 mEq/L (ref 19–32)
Calcium: 9.2 mg/dL (ref 8.4–10.5)
Chloride: 101 mEq/L (ref 96–112)
Creatinine, Ser: 0.81 mg/dL (ref 0.40–1.50)
GFR: 108.08 mL/min (ref >60.00)
GLUCOSE: 126 mg/dL — AB (ref 70–99)
POTASSIUM: 3.9 meq/L (ref 3.5–5.1)
SODIUM: 134 meq/L — AB (ref 135–145)
TOTAL PROTEIN: 6.7 g/dL (ref 6.0–8.3)
Total Bilirubin: 0.6 mg/dL (ref 0.2–1.2)

## 2015-12-11 LAB — CBC
HCT: 42.2 % (ref 39.0–52.0)
Hemoglobin: 14.1 g/dL (ref 13.0–17.0)
MCHC: 33.4 g/dL (ref 30.0–36.0)
MCV: 87.3 fl (ref 78.0–100.0)
Platelets: 305 10*3/uL (ref 150.0–400.0)
RBC: 4.83 Mil/uL (ref 4.22–5.81)
RDW: 13 % (ref 11.5–15.5)
WBC: 8.5 10*3/uL (ref 4.0–10.5)

## 2015-12-11 LAB — LIPID PANEL
CHOLESTEROL: 119 mg/dL (ref 0–200)
HDL: 36.9 mg/dL — ABNORMAL LOW (ref >39.00)
LDL CALC: 64 mg/dL (ref 0–99)
NonHDL: 82.26
Total CHOL/HDL Ratio: 3
Triglycerides: 89 mg/dL (ref 0.0–149.0)
VLDL: 17.8 mg/dL (ref 0.0–40.0)

## 2015-12-11 LAB — MICROALBUMIN / CREATININE URINE RATIO
CREATININE, U: 23.5 mg/dL
MICROALB/CREAT RATIO: 3 mg/g (ref 0.0–30.0)

## 2015-12-11 LAB — HEMOGLOBIN A1C: HEMOGLOBIN A1C: 6.5 % (ref 4.6–6.5)

## 2015-12-11 LAB — TSH: TSH: 1.44 u[IU]/mL (ref 0.35–4.50)

## 2015-12-18 ENCOUNTER — Ambulatory Visit (INDEPENDENT_AMBULATORY_CARE_PROVIDER_SITE_OTHER): Payer: Managed Care, Other (non HMO) | Admitting: Family Medicine

## 2015-12-18 ENCOUNTER — Encounter: Payer: Self-pay | Admitting: Family Medicine

## 2015-12-18 DIAGNOSIS — I10 Essential (primary) hypertension: Secondary | ICD-10-CM

## 2015-12-18 DIAGNOSIS — E119 Type 2 diabetes mellitus without complications: Secondary | ICD-10-CM

## 2015-12-18 DIAGNOSIS — E669 Obesity, unspecified: Secondary | ICD-10-CM

## 2015-12-18 DIAGNOSIS — E782 Mixed hyperlipidemia: Secondary | ICD-10-CM | POA: Diagnosis not present

## 2015-12-18 NOTE — Assessment & Plan Note (Signed)
Encouraged DASH diet, decrease po intake and increase exercise as tolerated. Needs 7-8 hours of sleep nightly. Avoid trans fats, eat small, frequent meals every 4-5 hours with lean proteins, complex carbs and healthy fats. Minimize simple carbs 

## 2015-12-18 NOTE — Progress Notes (Signed)
Patient ID: Craig Vincent Bakos, male   DOB: 03-11-1965, 50 y.o.   MRN: [id number]   Subjective:    Patient ID: Craig Vincent Jeff, male    DOB: 03-11-1965, 50 y.o.   MRN: [id number]  Chief Complaint  Patient presents with  . Follow-up    HPI Patient is in today for follow up. He feels well. No recent hospitalization or acute concern. He has had a recent URI but is improving now. Sugars have been well controlled although he does note sometimes when he does not eat in evening his blodd sugars are highest in 130s in am. No polyuria or polydipsia. Denies CP/palp/SOB/HA/congestion/fevers/GI or GU c/o. Taking meds as prescribed  Past Medical History:  Diagnosis Date  . Dermatitis 07/01/2014  . Diabetes mellitus without complication (HCC)   . Hyperlipidemia   . Hypertension   . Obesity 05/17/2014    Past Surgical History:  Procedure Laterality Date  . EYE SURGERY    . HERNIA REPAIR    . INGUINAL HERNIA REPAIR      Family History  Problem Relation Age of Onset  . Stroke Mother     ?  Marland Kitchen. Thrombosis Mother     brain aneurysm  . Clotting disorder Mother     DVTs  . Cancer Father   . Stroke Father     3 strokes  . Cancer Sister     breast  . Cancer Paternal Grandfather     leukemia  . Clotting disorder Sister     lupus anticoagulant positive  . Hyperlipidemia Brother   . Hypertension Brother   . Heart disease Brother     MI    Social History   Social History  . Marital status: Married    Spouse name: N/A  . Number of children: N/A  . Years of education: 6512   Occupational History  . 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 Smoking status: Never Smoker  . Smokeless tobacco: Never Used  . Alcohol use No  . Drug use: No  . 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 Not on file   Social History Narrative  . No narrative  on file    Outpatient Medications Prior to Visit  Medication Sig Dispense Refill  . atorvastatin (LIPITOR) 20 MG tablet Take 1 tablet (20 mg total) by mouth daily at 6 PM. 90 tablet 1  . glipiZIDE (GLUCOTROL) 5 MG tablet Take 1 tablet (5 mg total) by mouth daily before breakfast. 30 tablet 0  . glucose blood (FREESTYLE LITE) test strip Use as instructed once a day to check blood sugar.  DX E11.9 100 each 6  . Lancets (FREESTYLE) lancets Use as instructed once a day to check blood sugar.  DX E11.9 100 each 6  . lisinopril (PRINIVIL,ZESTRIL) 10 MG tablet Take 1 tablet (10 mg total) by mouth 2 (two) times daily. 60 tablet 6  . metFORMIN (GLUCOPHAGE XR) 750 MG 24 hr tablet Take 1 tablet (750 mg total) by mouth 3 (three) times daily. 90 tablet 6   No facility-administered medications prior to visit.     No Known Allergies  Review of Systems  Constitutional: Positive for malaise/fatigue. Negative for fever.  Eyes: Negative for blurred vision.  Respiratory: Negative for cough and shortness of breath.   Cardiovascular: Negative for chest pain and  palpitations.  Gastrointestinal: Negative for vomiting.  Musculoskeletal: Negative for back pain.  Skin: Negative for rash.  Neurological: Negative for loss of consciousness and headaches.       Objective:    Physical Exam  Constitutional: He is oriented to person, place, and time. He appears well-developed and well-nourished. No distress.  HENT:  Head: Normocephalic and atraumatic.  Eyes: Conjunctivae are normal.  Neck: Normal range of motion. No thyromegaly present.  Cardiovascular: Normal rate and regular rhythm.   Pulmonary/Chest: Effort normal and breath sounds normal. He has no wheezes.  Abdominal: Soft. Bowel sounds are normal. There is no tenderness.  Musculoskeletal: Normal range of motion. He exhibits no edema or deformity.  Neurological: He is alert and oriented to person, place, and time.  Skin: Skin is warm and dry. He is not  diaphoretic.  Psychiatric: He has a normal mood and affect.    BP 112/68 (BP Location: Left Arm, Patient Position: Sitting, Cuff Size: Normal)   Pulse 80   Temp 98.1 F (36.7 C) (Oral)   Wt 260 lb (117.9 kg)   SpO2 96%   BMI 37.31 kg/m  Wt Readings from Last 3 Encounters:  12/18/15 260 lb (117.9 kg)  06/16/15 256 lb 4 oz (116.2 kg)  09/27/14 260 lb 2 oz (118 kg)     Lab Results  Component Value Date   WBC 8.5 12/11/2015   HGB 14.1 12/11/2015   HCT 42.2 12/11/2015   PLT 305.0 12/11/2015   GLUCOSE 126 (H) 12/11/2015   CHOL 119 12/11/2015   TRIG 89.0 12/11/2015   HDL 36.90 (L) 12/11/2015   LDLCALC 64 12/11/2015   ALT 32 12/11/2015   AST 22 12/11/2015   NA 134 (L) 12/11/2015   K 3.9 12/11/2015   CL 101 12/11/2015   CREATININE 0.81 12/11/2015   BUN 14 12/11/2015   CO2 27 12/11/2015   TSH 1.44 12/11/2015   HGBA1C 6.5 12/11/2015   MICROALBUR <0.7 12/11/2015    Lab Results  Component Value Date   TSH 1.44 12/11/2015   Lab Results  Component Value Date   WBC 8.5 12/11/2015   HGB 14.1 12/11/2015   HCT 42.2 12/11/2015   MCV 87.3 12/11/2015   PLT 305.0 12/11/2015   Lab Results  Component Value Date   NA 134 (L) 12/11/2015   K 3.9 12/11/2015   CO2 27 12/11/2015   GLUCOSE 126 (H) 12/11/2015   BUN 14 12/11/2015   CREATININE 0.81 12/11/2015   BILITOT 0.6 12/11/2015   ALKPHOS 75 12/11/2015   AST 22 12/11/2015   ALT 32 12/11/2015   PROT 6.7 12/11/2015   ALBUMIN 4.1 12/11/2015   CALCIUM 9.2 12/11/2015   GFR 108.08 12/11/2015   Lab Results  Component Value Date   CHOL 119 12/11/2015   Lab Results  Component Value Date   HDL 36.90 (L) 12/11/2015   Lab Results  Component Value Date   LDLCALC 64 12/11/2015   Lab Results  Component Value Date   TRIG 89.0 12/11/2015   Lab Results  Component Value Date   CHOLHDL 3 12/11/2015   Lab Results  Component Value Date   HGBA1C 6.5 12/11/2015       Assessment & Plan:   Problem List Items Addressed This  Visit    Diabetes mellitus without complication (HCC)    hgba1c acceptable, minimize simple carbs. Increase exercise as tolerated. Continue current meds      Relevant Orders   Hemoglobin A1c   Hyperlipidemia  Tolerating statin, encouraged heart healthy diet, avoid trans fats, minimize simple carbs and saturated fats. Increase exercise as tolerated      Relevant Orders   TSH   Hypertension    Well controlled, no changes to meds. Encouraged heart healthy diet such as the DASH diet and exercise as tolerated.       Relevant Orders   Hemoglobin A1c   Comprehensive metabolic panel   CBC   Lipid panel   Obesity    Encouraged DASH diet, decrease po intake and increase exercise as tolerated. Needs 7-8 hours of sleep nightly. Avoid trans fats, eat small, frequent meals every 4-5 hours with lean proteins, complex carbs and healthy fats. Minimize simple carbs         I am having Mr. Dines maintain his glipiZIDE, lisinopril, metFORMIN, atorvastatin, glucose blood, and freestyle.  No orders of the defined types were placed in this encounter.    Danise EdgeBLYTH, STACEY, MD

## 2015-12-18 NOTE — Patient Instructions (Addendum)
For colds try: ZINC Elderberry Vitamin-C Black Garlic Mucinex   DASH Eating Plan DASH stands for "Dietary Approaches to Stop Hypertension." The DASH eating plan is a healthy eating plan that has been shown to reduce high blood pressure (hypertension). Additional health benefits may include reducing the risk of type 2 diabetes mellitus, heart disease, and stroke. The DASH eating plan may also help with weight loss. What do I need to know about the DASH eating plan? For the DASH eating plan, you will follow these general guidelines:  Choose foods with less than 150 milligrams of sodium per serving (as listed on the food label).  Use salt-free seasonings or herbs instead of table salt or sea salt.  Check with your health care provider or pharmacist before using salt substitutes.  Eat lower-sodium products. These are often labeled as "low-sodium" or "no salt added."  Eat fresh foods. Avoid eating a lot of canned foods.  Eat more vegetables, fruits, and low-fat dairy products.  Choose whole grains. Look for the word "whole" as the first word in the ingredient list.  Choose fish and skinless chicken or [place] more often than red meat. Limit fish, poultry, and meat to 6 oz (170 g) each day.  Limit sweets, desserts, sugars, and sugary drinks.  Choose heart-healthy fats.  Eat more home-cooked food and less restaurant, buffet, and fast food.  Limit fried foods.  Do not fry foods. Cook foods using methods such as baking, boiling, grilling, and broiling instead.  When eating at a restaurant, ask that your food be prepared with less salt, or no salt if possible. What foods can I eat? Seek help from a dietitian for individual calorie needs. Grains  Whole grain or whole wheat bread. Brown rice. Whole grain or whole wheat pasta. Quinoa, bulgur, and whole grain cereals. Low-sodium cereals. Corn or whole wheat flour tortillas. Whole grain cornbread. Whole grain crackers. Low-sodium  crackers. Vegetables  Fresh or frozen vegetables (raw, steamed, roasted, or grilled). Low-sodium or reduced-sodium tomato and vegetable juices. Low-sodium or reduced-sodium tomato sauce and paste. Low-sodium or reduced-sodium canned vegetables. Fruits  All fresh, canned (in natural juice), or frozen fruits. Meat and Other Protein Products  Ground beef (85% or leaner), grass-fed beef, or beef trimmed of fat. Skinless chicken or [place]. Ground chicken or [place]. Pork trimmed of fat. All fish and seafood. Eggs. Dried beans, peas, or lentils. Unsalted nuts and seeds. Unsalted canned beans. Dairy  Low-fat dairy products, such as skim or 1% milk, 2% or reduced-fat cheeses, low-fat ricotta or cottage cheese, or plain low-fat yogurt. Low-sodium or reduced-sodium cheeses. Fats and Oils  Tub margarines without trans fats. Light or reduced-fat mayonnaise and salad dressings (reduced sodium). Avocado. Safflower, olive, or canola oils. Natural peanut or almond butter. Other  Unsalted popcorn and pretzels. The items listed above may not be a complete list of recommended foods or beverages. Contact your dietitian for more options.  What foods are not recommended? Grains  White bread. White pasta. White rice. Refined cornbread. Bagels and croissants. Crackers that contain trans fat. Vegetables  Creamed or fried vegetables. Vegetables in a cheese sauce. Regular canned vegetables. Regular canned tomato sauce and paste. Regular tomato and vegetable juices. Fruits  Canned fruit in light or heavy syrup. Fruit juice. Meat and Other Protein Products  Fatty cuts of meat. Ribs, chicken wings, bacon, sausage, bologna, salami, chitterlings, fatback, hot dogs, bratwurst, and packaged luncheon meats. Salted nuts and seeds. Canned beans with salt. Dairy  Whole or 2% milk,  cream, half-and-half, and cream cheese. Whole-fat or sweetened yogurt. Full-fat cheeses or blue cheese. Nondairy creamers and whipped toppings.  Processed cheese, cheese spreads, or cheese curds. Condiments  Onion and garlic salt, seasoned salt, table salt, and sea salt. Canned and packaged gravies. Worcestershire sauce. Tartar sauce. Barbecue sauce. Teriyaki sauce. Soy sauce, including reduced sodium. Steak sauce. Fish sauce. Oyster sauce. Cocktail sauce. Horseradish. Ketchup and mustard. Meat flavorings and tenderizers. Bouillon cubes. Hot sauce. Tabasco sauce. Marinades. Taco seasonings. Relishes. Fats and Oils  Butter, stick margarine, lard, shortening, ghee, and bacon fat. Coconut, palm kernel, or palm oils. Regular salad dressings. Other  Pickles and olives. Salted popcorn and pretzels. The items listed above may not be a complete list of foods and beverages to avoid. Contact your dietitian for more information.  Where can I find more information? National [employer], [employer], [employer] Institute: CableProm[employer].itwww.nhlbi.nih.gov/health/health-topics/topics/dash/ This information is not intended to replace advice given to you by your health care provider. Make sure you discuss any questions you have with your health care provider. Document Released: 01/03/2011 Document Revised: 06/22/2015 Document Reviewed: 11/18/2012 [employer] Interactive Patient Education  2017 [employer]

## 2015-12-18 NOTE — Assessment & Plan Note (Signed)
Well controlled, no changes to meds. Encouraged heart healthy diet such as the DASH diet and exercise as tolerated.  °

## 2015-12-18 NOTE — Progress Notes (Signed)
Pre visit review using our clinic review tool, if applicable. No additional management support is needed unless otherwise documented below in the visit note. 

## 2015-12-18 NOTE — Assessment & Plan Note (Signed)
hgba1c acceptable, minimize simple carbs. Increase exercise as tolerated. Continue current meds 

## 2015-12-18 NOTE — Assessment & Plan Note (Signed)
Tolerating statin, encouraged heart healthy diet, avoid trans fats, minimize simple carbs and saturated fats. Increase exercise as tolerated 

## 2016-02-19 ENCOUNTER — Other Ambulatory Visit: Payer: Self-pay | Admitting: Family Medicine

## 2016-02-19 DIAGNOSIS — E785 Hyperlipidemia, unspecified: Secondary | ICD-10-CM

## 2016-02-19 DIAGNOSIS — E669 Obesity, unspecified: Secondary | ICD-10-CM

## 2016-02-19 DIAGNOSIS — E119 Type 2 diabetes mellitus without complications: Secondary | ICD-10-CM

## 2016-02-19 DIAGNOSIS — R21 Rash and other nonspecific skin eruption: Secondary | ICD-10-CM

## 2016-02-19 DIAGNOSIS — I10 Essential (primary) hypertension: Secondary | ICD-10-CM

## 2016-02-20 ENCOUNTER — Other Ambulatory Visit: Payer: Self-pay | Admitting: Family Medicine

## 2016-02-20 DIAGNOSIS — E669 Obesity, unspecified: Secondary | ICD-10-CM

## 2016-02-20 DIAGNOSIS — R21 Rash and other nonspecific skin eruption: Secondary | ICD-10-CM

## 2016-02-20 DIAGNOSIS — E119 Type 2 diabetes mellitus without complications: Secondary | ICD-10-CM

## 2016-02-20 DIAGNOSIS — E785 Hyperlipidemia, unspecified: Secondary | ICD-10-CM

## 2016-02-20 DIAGNOSIS — I10 Essential (primary) hypertension: Secondary | ICD-10-CM

## 2016-02-20 MED ORDER — METFORMIN HCL ER 750 MG PO TB24
750.0000 mg | ORAL_TABLET | Freq: Three times a day (TID) | ORAL | 3 refills | Status: AC
Start: 2016-02-20 — End: ?

## 2016-02-20 MED ORDER — LISINOPRIL 10 MG PO TABS: 10.0000 mg | ORAL_TABLET | Freq: Two times a day (BID) | ORAL | 3 refills | Status: AC

## 2016-06-18 ENCOUNTER — Other Ambulatory Visit: Payer: Managed Care, Other (non HMO)

## 2016-06-25 ENCOUNTER — Encounter: Payer: Managed Care, Other (non HMO) | Admitting: Family Medicine

## 2016-07-24 ENCOUNTER — Telehealth: Payer: Self-pay | Admitting: Family Medicine

## 2016-07-24 NOTE — Telephone Encounter (Signed)
error 

## 2016-09-06 IMAGING — DX DG SHOULDER 2+V*R*
3 series · 3 of 3 positions shown · non-contrast
Comparison: None.

CLINICAL DATA: Pain following fall on wet grass

EXAM:
RIGHT SHOULDER - 2+ VIEW

[shoulder grashey]
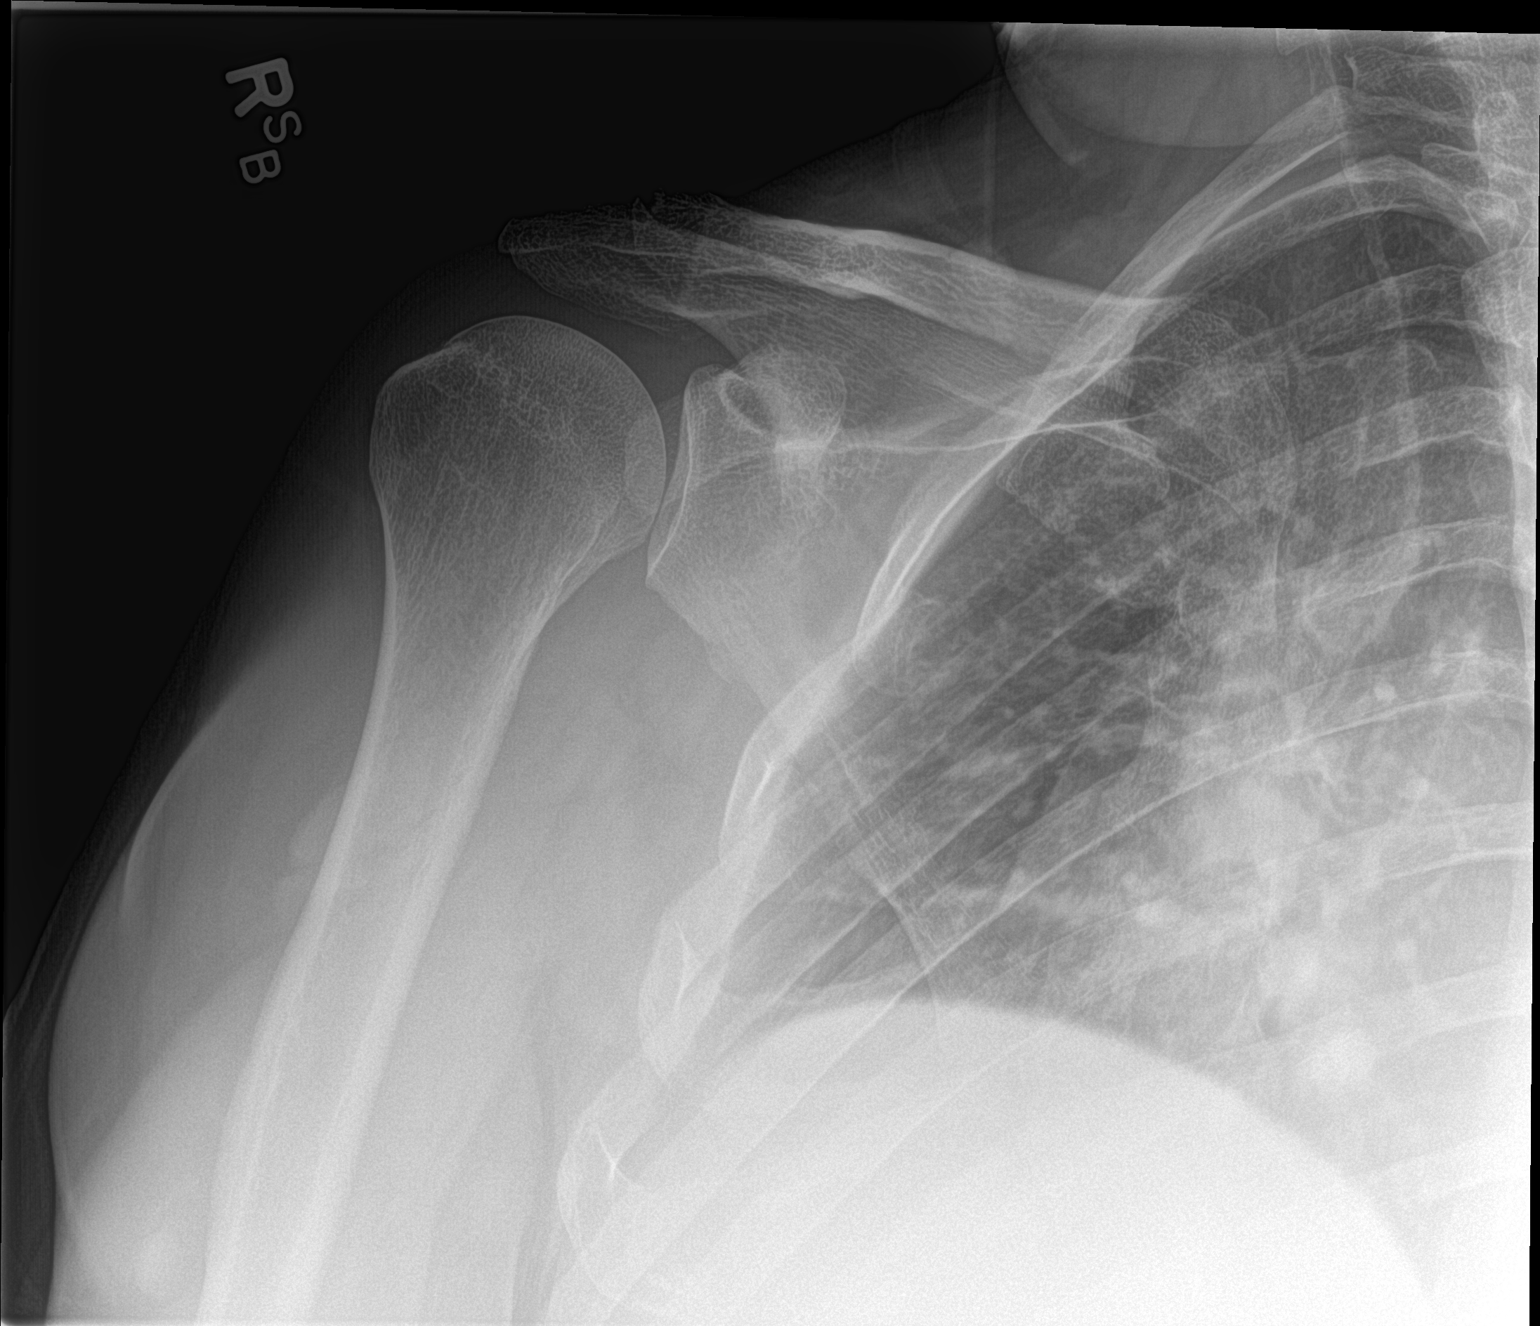

[shoulder y view]
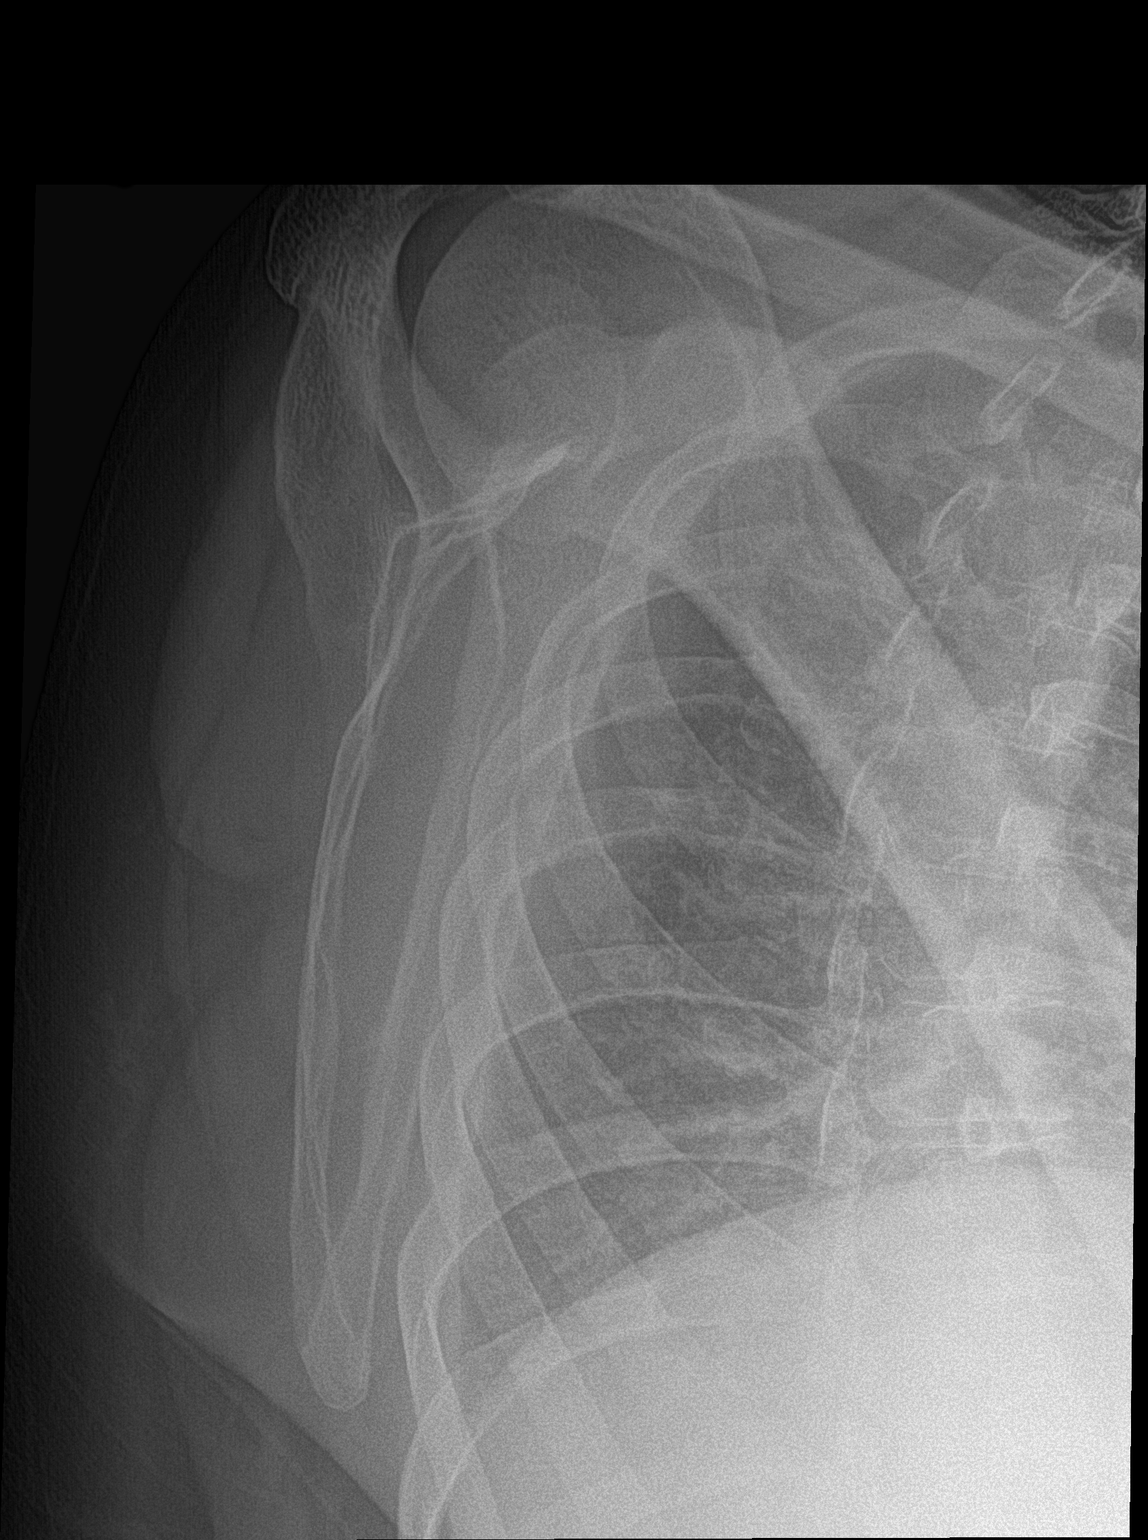

[shoulder axillary]
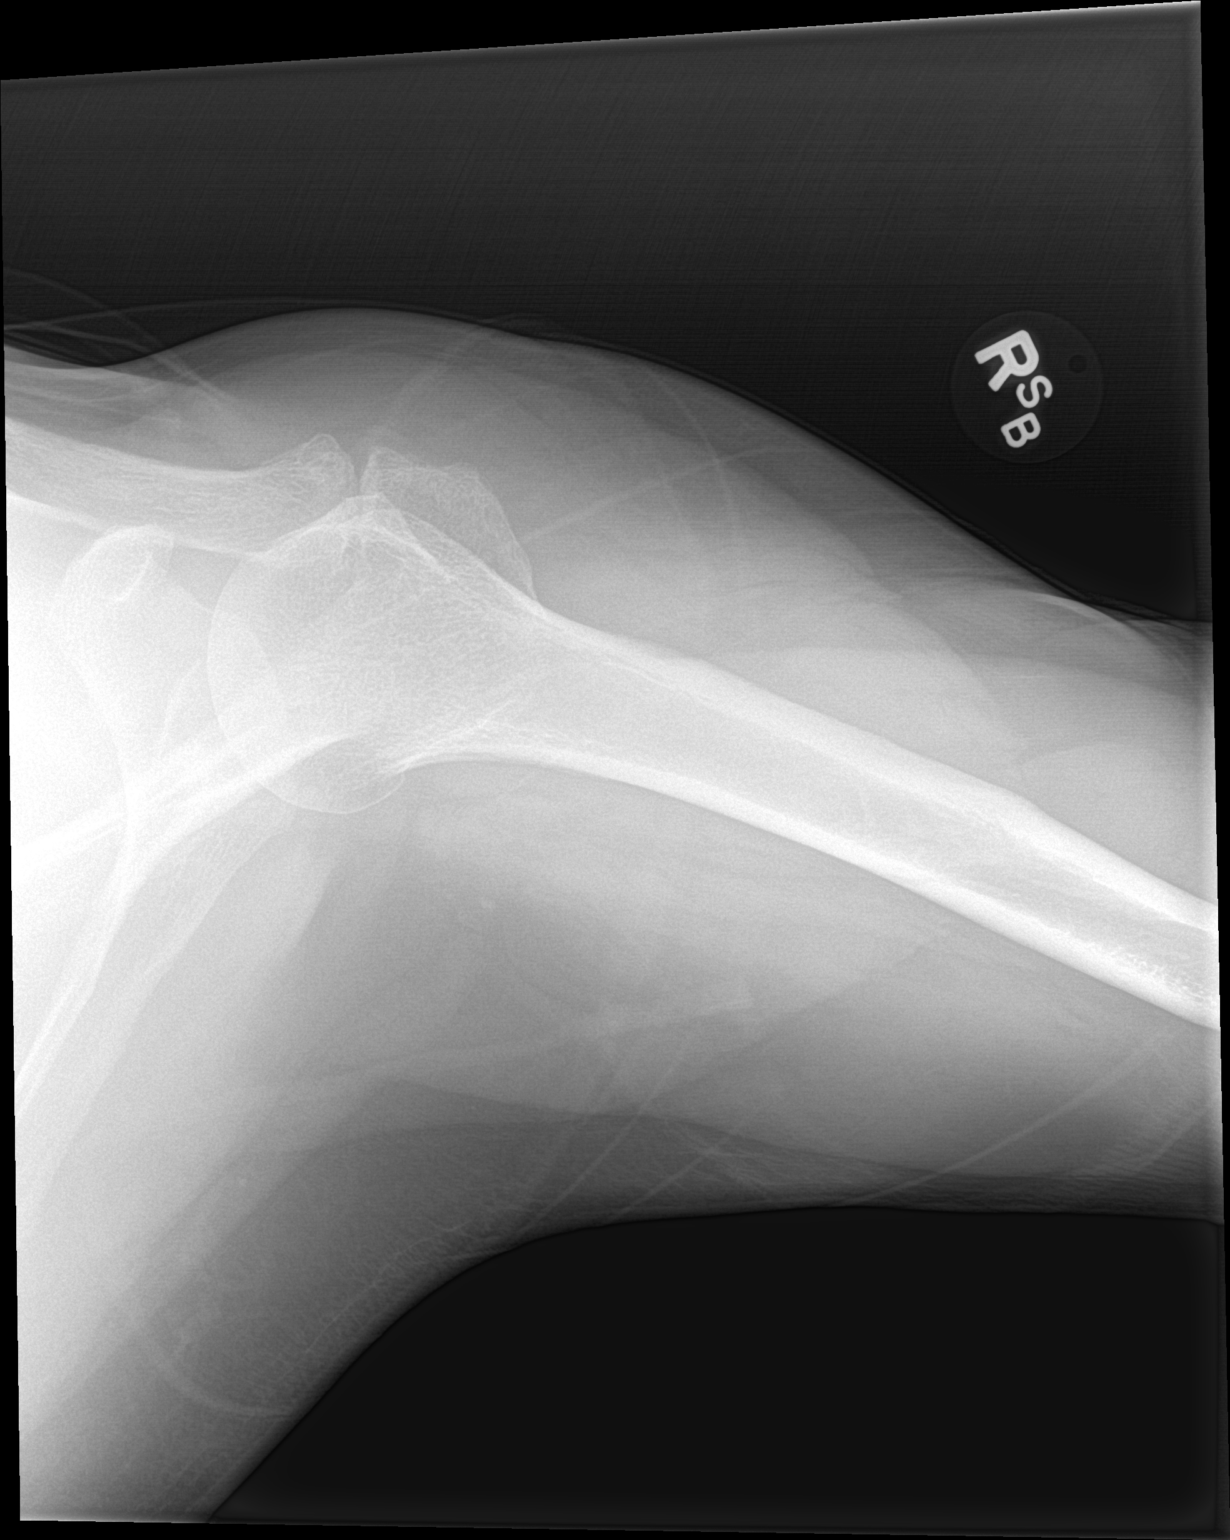

[3 of 3 positions shown; findings below may reference images not displayed]

FINDINGS: Frontal, Y scapular, and axillary images obtained. There is no
fracture or dislocation. Joint spaces appear intact. No erosive
change or intra-articular calcification.
IMPRESSION: No fracture or dislocation.  No appreciable arthropathy.
